# Patient Record
Sex: Female | Born: 1955 | Race: Black or African American | Hispanic: No | Marital: Married | Smoking: Former smoker
Health system: Southern US, Community
[De-identification: ages and names within clinical notes are randomized; demographics above are authoritative.]

## PROBLEM LIST (undated history)

## (undated) DIAGNOSIS — E7800 Pure hypercholesterolemia, unspecified: Secondary | ICD-10-CM

## (undated) DIAGNOSIS — I10 Essential (primary) hypertension: Secondary | ICD-10-CM

## (undated) DIAGNOSIS — M109 Gout, unspecified: Secondary | ICD-10-CM

## (undated) DIAGNOSIS — E119 Type 2 diabetes mellitus without complications: Secondary | ICD-10-CM

## (undated) HISTORY — PX: FRACTURE SURGERY: SHX138

## (undated) HISTORY — PX: ORIF ANKLE FRACTURE: SUR919

---

## 2004-01-26 ENCOUNTER — Emergency Department (HOSPITAL_COMMUNITY): Admission: EM | Admit: 2004-01-26 | Discharge: 2004-01-26 | Payer: Self-pay | Admitting: Emergency Medicine

## 2005-07-15 ENCOUNTER — Ambulatory Visit: Payer: Self-pay | Admitting: Family Medicine

## 2005-07-15 ENCOUNTER — Other Ambulatory Visit: Admission: RE | Admit: 2005-07-15 | Discharge: 2005-07-15 | Payer: Self-pay | Admitting: Family Medicine

## 2005-07-29 ENCOUNTER — Ambulatory Visit: Payer: Self-pay | Admitting: Family Medicine

## 2005-08-19 ENCOUNTER — Ambulatory Visit: Payer: Self-pay | Admitting: Family Medicine

## 2006-04-21 ENCOUNTER — Ambulatory Visit: Payer: Self-pay | Admitting: Internal Medicine

## 2006-06-09 ENCOUNTER — Ambulatory Visit: Payer: Self-pay | Admitting: Family Medicine

## 2007-06-22 ENCOUNTER — Ambulatory Visit: Payer: Self-pay | Admitting: Internal Medicine

## 2007-06-22 LAB — CONVERTED CEMR LAB
ALT: 11 units/L (ref 0–35)
AST: 15 units/L (ref 0–37)
Albumin: 4.1 g/dL (ref 3.5–5.2)
Alkaline Phosphatase: 42 units/L (ref 39–117)
BUN: 13 mg/dL (ref 6–23)
Basophils Absolute: 0 10*3/uL (ref 0.0–0.1)
Basophils Relative: 0 % (ref 0–1)
CO2: 20 meq/L (ref 19–32)
Calcium: 9.4 mg/dL (ref 8.4–10.5)
Chloride: 107 meq/L (ref 96–112)
Cholesterol: 217 mg/dL — ABNORMAL HIGH (ref 0–200)
Creatinine, Ser: 1.05 mg/dL (ref 0.40–1.20)
Eosinophils Absolute: 0.1 10*3/uL (ref 0.0–0.7)
Eosinophils Relative: 1 % (ref 0–5)
Glucose, Bld: 103 mg/dL — ABNORMAL HIGH (ref 70–99)
HCT: 36.2 % (ref 36.0–46.0)
HDL: 39 mg/dL — ABNORMAL LOW (ref >39)
Hemoglobin: 11.4 g/dL — ABNORMAL LOW (ref 12.0–15.0)
LDL Cholesterol: 118 mg/dL — ABNORMAL HIGH (ref 0–99)
Lymphocytes Relative: 35 % (ref 12–46)
Lymphs Abs: 2.5 10*3/uL (ref 0.7–3.3)
MCHC: 31.5 g/dL (ref 30.0–36.0)
MCV: 75.7 fL — ABNORMAL LOW (ref 78.0–100.0)
Monocytes Absolute: 0.5 10*3/uL (ref 0.2–0.7)
Monocytes Relative: 7 % (ref 3–11)
Neutro Abs: 4 10*3/uL (ref 1.7–7.7)
Neutrophils Relative %: 56 % (ref 43–77)
Platelets: 299 10*3/uL (ref 150–400)
Potassium: 4.1 meq/L (ref 3.5–5.3)
RBC: 4.78 M/uL (ref 3.87–5.11)
RDW: 17.9 % — ABNORMAL HIGH (ref 11.5–14.0)
Sodium: 139 meq/L (ref 135–145)
Total Bilirubin: 0.2 mg/dL — ABNORMAL LOW (ref 0.3–1.2)
Total CHOL/HDL Ratio: 5.6
Total Protein: 7.5 g/dL (ref 6.0–8.3)
Triglycerides: 301 mg/dL — ABNORMAL HIGH (ref <150)
VLDL: 60 mg/dL — ABNORMAL HIGH (ref 0–40)
WBC: 7 10*3/uL (ref 4.0–10.5)

## 2008-02-19 ENCOUNTER — Ambulatory Visit: Payer: Self-pay | Admitting: Internal Medicine

## 2008-03-14 ENCOUNTER — Ambulatory Visit: Payer: Self-pay | Admitting: Cardiovascular Disease

## 2008-04-21 ENCOUNTER — Ambulatory Visit: Payer: Self-pay

## 2008-04-21 ENCOUNTER — Encounter: Payer: Self-pay | Admitting: Cardiovascular Disease

## 2008-09-12 ENCOUNTER — Ambulatory Visit: Payer: Self-pay | Admitting: Family Medicine

## 2009-01-27 ENCOUNTER — Inpatient Hospital Stay (HOSPITAL_COMMUNITY): Admission: EM | Admit: 2009-01-27 | Discharge: 2009-01-30 | Payer: Self-pay | Admitting: Emergency Medicine

## 2009-04-24 ENCOUNTER — Encounter: Admission: RE | Admit: 2009-04-24 | Discharge: 2009-06-08 | Payer: Self-pay | Admitting: Orthopaedic Surgery

## 2009-04-26 ENCOUNTER — Ambulatory Visit: Payer: Self-pay | Admitting: Internal Medicine

## 2009-06-07 ENCOUNTER — Ambulatory Visit: Payer: Self-pay | Admitting: Internal Medicine

## 2009-06-07 ENCOUNTER — Encounter: Payer: Self-pay | Admitting: Family Medicine

## 2009-06-07 LAB — CONVERTED CEMR LAB
ALT: 11 units/L (ref 0–35)
AST: 13 units/L (ref 0–37)
Albumin: 3.9 g/dL (ref 3.5–5.2)
Alkaline Phosphatase: 38 units/L — ABNORMAL LOW (ref 39–117)
BUN: 14 mg/dL (ref 6–23)
Basophils Absolute: 0 10*3/uL (ref 0.0–0.1)
Basophils Relative: 0 % (ref 0–1)
CO2: 20 meq/L (ref 19–32)
Calcium: 9.1 mg/dL (ref 8.4–10.5)
Chloride: 105 meq/L (ref 96–112)
Cholesterol: 193 mg/dL (ref 0–200)
Creatinine, Ser: 0.76 mg/dL (ref 0.40–1.20)
Eosinophils Absolute: 0.1 10*3/uL (ref 0.0–0.7)
Eosinophils Relative: 1 % (ref 0–5)
Glucose, Bld: 112 mg/dL — ABNORMAL HIGH (ref 70–99)
HCT: 34.2 % — ABNORMAL LOW (ref 36.0–46.0)
HDL: 36 mg/dL — ABNORMAL LOW (ref >39)
Hemoglobin: 11.2 g/dL — ABNORMAL LOW (ref 12.0–15.0)
Lymphocytes Relative: 36 % (ref 12–46)
Lymphs Abs: 2.4 10*3/uL (ref 0.7–4.0)
MCHC: 32.7 g/dL (ref 30.0–36.0)
MCV: 88.8 fL (ref 78.0–100.0)
Monocytes Absolute: 0.6 10*3/uL (ref 0.1–1.0)
Monocytes Relative: 9 % (ref 3–12)
Neutro Abs: 3.6 10*3/uL (ref 1.7–7.7)
Neutrophils Relative %: 54 % (ref 43–77)
Platelets: 302 10*3/uL (ref 150–400)
Potassium: 4.3 meq/L (ref 3.5–5.3)
RBC: 3.85 M/uL — ABNORMAL LOW (ref 3.87–5.11)
RDW: 16 % — ABNORMAL HIGH (ref 11.5–15.5)
Sed Rate: 38 mm/hr — ABNORMAL HIGH (ref 0–22)
Sodium: 136 meq/L (ref 135–145)
TSH: 0.877 microintl units/mL (ref 0.350–4.500)
Total Bilirubin: 0.2 mg/dL — ABNORMAL LOW (ref 0.3–1.2)
Total CHOL/HDL Ratio: 5.4
Total Protein: 6.9 g/dL (ref 6.0–8.3)
Triglycerides: 416 mg/dL — ABNORMAL HIGH (ref <150)
WBC: 6.7 10*3/uL (ref 4.0–10.5)

## 2009-06-14 ENCOUNTER — Encounter: Payer: Self-pay | Admitting: Family Medicine

## 2009-06-14 ENCOUNTER — Ambulatory Visit: Payer: Self-pay | Admitting: Internal Medicine

## 2009-06-14 LAB — CONVERTED CEMR LAB
Chlamydia, DNA Probe: NEGATIVE
GC Probe Amp, Genital: NEGATIVE

## 2009-08-11 ENCOUNTER — Ambulatory Visit: Payer: Self-pay | Admitting: Family Medicine

## 2009-08-16 ENCOUNTER — Ambulatory Visit: Payer: Self-pay | Admitting: *Deleted

## 2009-09-06 ENCOUNTER — Ambulatory Visit: Payer: Self-pay | Admitting: Internal Medicine

## 2009-09-06 ENCOUNTER — Encounter: Payer: Self-pay | Admitting: Family Medicine

## 2009-09-06 LAB — CONVERTED CEMR LAB
ALT: 33 units/L (ref 0–35)
AST: 23 units/L (ref 0–37)
Albumin: 4.6 g/dL (ref 3.5–5.2)
Alkaline Phosphatase: 43 units/L (ref 39–117)
BUN: 12 mg/dL (ref 6–23)
CO2: 19 meq/L (ref 19–32)
Calcium: 9.7 mg/dL (ref 8.4–10.5)
Chloride: 107 meq/L (ref 96–112)
Cholesterol: 179 mg/dL (ref 0–200)
Creatinine, Ser: 0.7 mg/dL (ref 0.40–1.20)
Glucose, Bld: 109 mg/dL — ABNORMAL HIGH (ref 70–99)
HDL: 38 mg/dL — ABNORMAL LOW (ref >39)
LDL Cholesterol: 116 mg/dL — ABNORMAL HIGH (ref 0–99)
Potassium: 4.5 meq/L (ref 3.5–5.3)
Sodium: 139 meq/L (ref 135–145)
Total Bilirubin: 0.3 mg/dL (ref 0.3–1.2)
Total CHOL/HDL Ratio: 4.7
Total Protein: 7.4 g/dL (ref 6.0–8.3)
Triglycerides: 127 mg/dL (ref <150)
VLDL: 25 mg/dL (ref 0–40)

## 2009-11-02 ENCOUNTER — Ambulatory Visit: Payer: Self-pay | Admitting: Internal Medicine

## 2009-11-03 ENCOUNTER — Ambulatory Visit (HOSPITAL_COMMUNITY): Admission: RE | Admit: 2009-11-03 | Discharge: 2009-11-03 | Payer: Self-pay | Admitting: Internal Medicine

## 2009-11-22 ENCOUNTER — Ambulatory Visit: Payer: Self-pay | Admitting: Internal Medicine

## 2010-05-28 ENCOUNTER — Ambulatory Visit: Payer: Self-pay | Admitting: Internal Medicine

## 2010-06-11 ENCOUNTER — Ambulatory Visit: Payer: Self-pay | Admitting: Internal Medicine

## 2010-06-18 ENCOUNTER — Ambulatory Visit: Payer: Self-pay | Admitting: Internal Medicine

## 2010-06-18 LAB — CONVERTED CEMR LAB: Microalb, Ur: 2.24 mg/dL — ABNORMAL HIGH (ref 0.00–1.89)

## 2010-06-26 ENCOUNTER — Ambulatory Visit (HOSPITAL_COMMUNITY): Admission: RE | Admit: 2010-06-26 | Discharge: 2010-06-26 | Payer: Self-pay | Admitting: Family Medicine

## 2010-08-08 IMAGING — CR DG KNEE 1-2V*R*
2 series · 2 of 2 positions shown · non-contrast
Comparison: None

CLINICAL DATA: Pain, swelling.  No known injury.

RIGHT KNEE - 1-2 VIEW

[w knee ap right *]
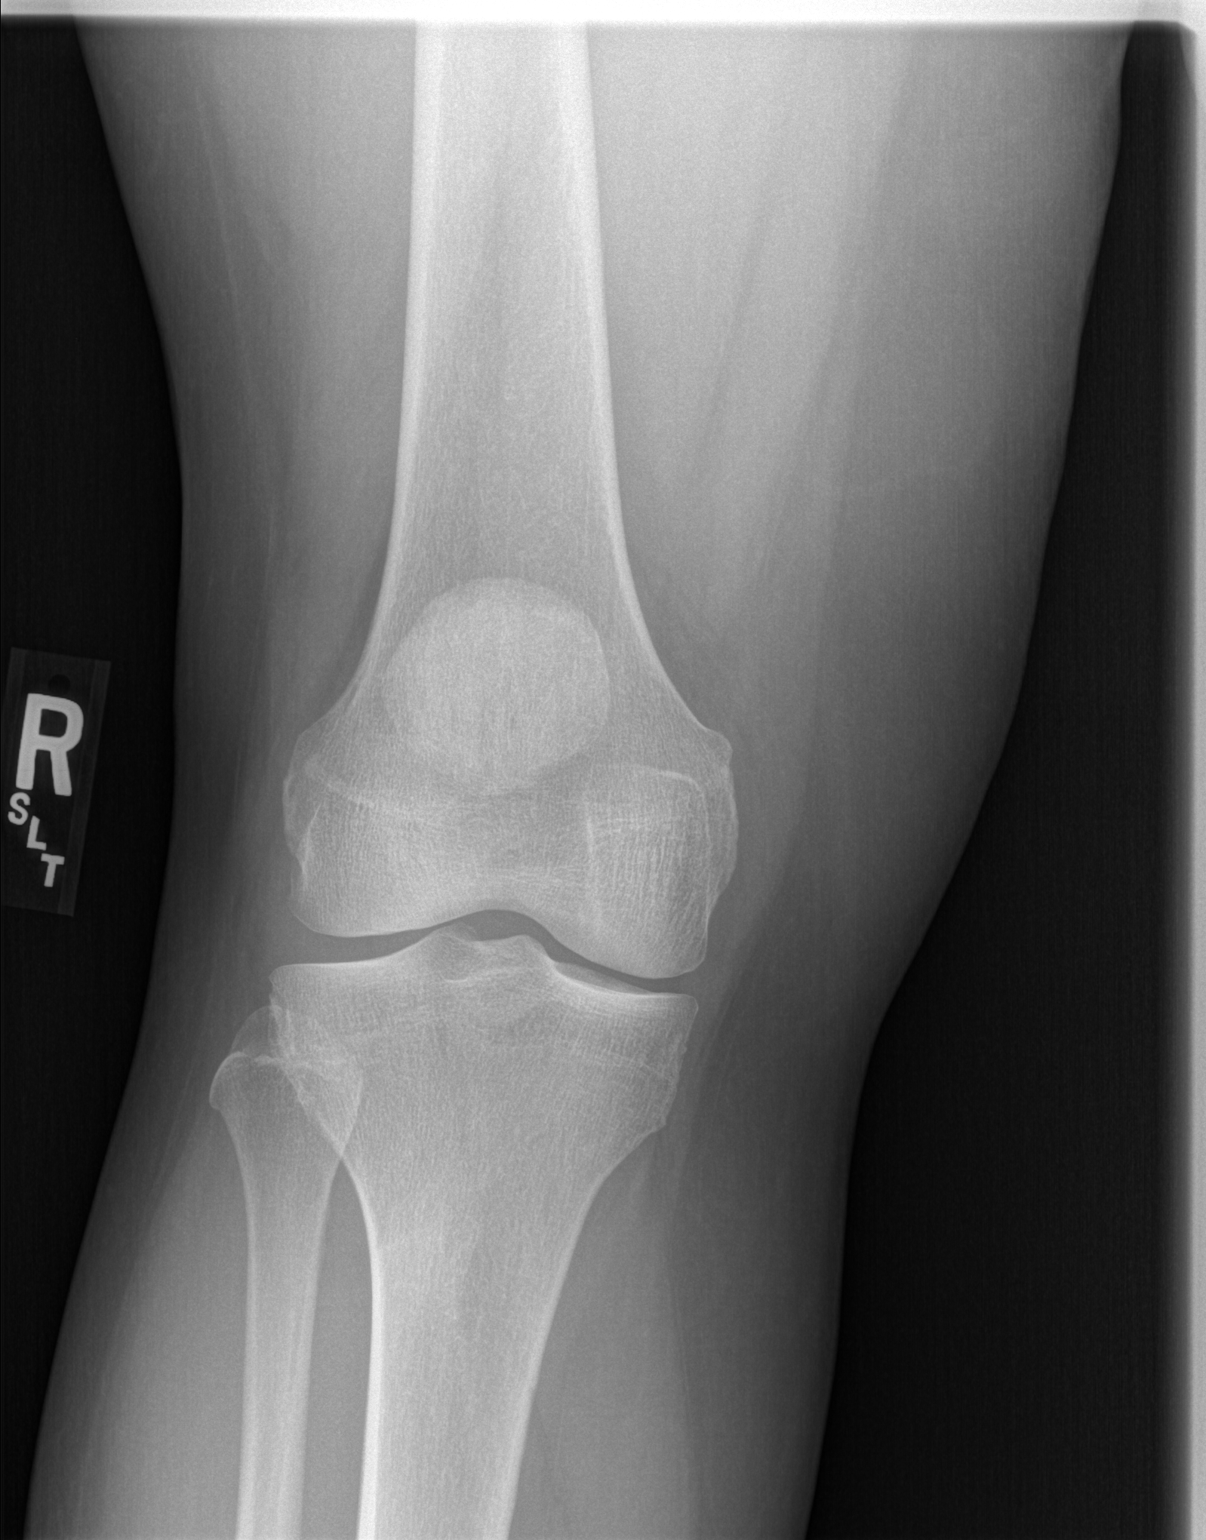

[w knee lat. right *]
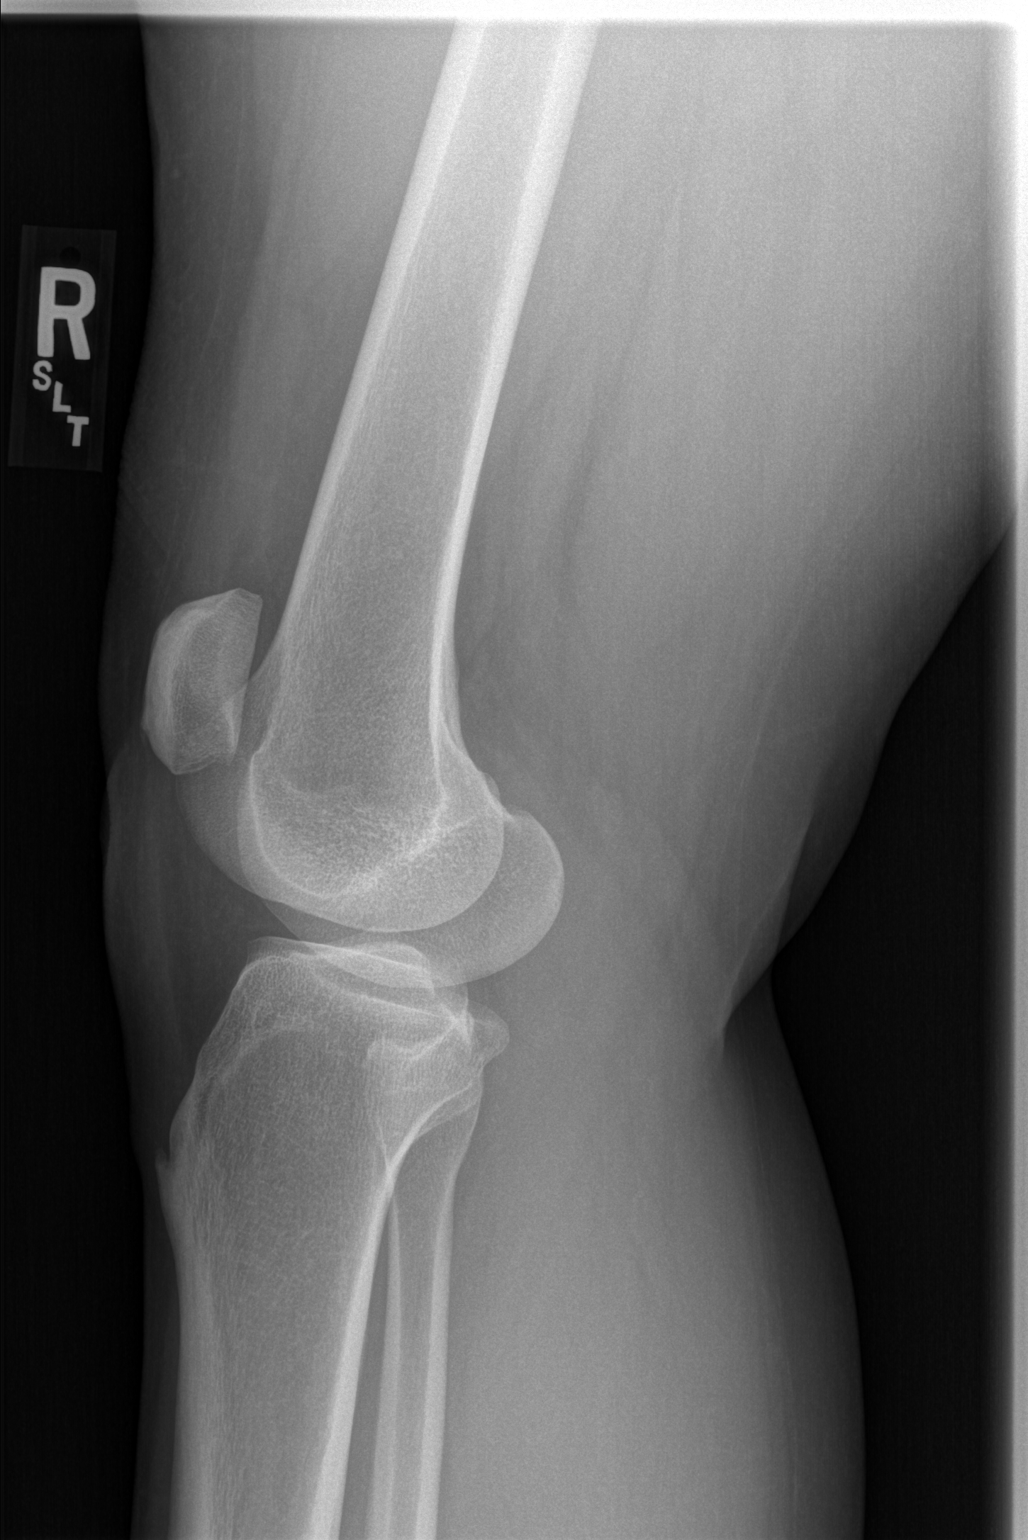

[2 of 2 positions shown; findings below may reference images not displayed]

FINDINGS: Patella appears high riding. No acute bony abnormality.
Specifically, no fracture, subluxation, or dislocation.  Soft
tissues are intact. No joint effusion.
IMPRESSION: Apparent high riding patella.  No acute bony abnormality.

## 2010-11-06 ENCOUNTER — Emergency Department (HOSPITAL_COMMUNITY)
Admission: EM | Admit: 2010-11-06 | Discharge: 2010-11-06 | Payer: Self-pay | Source: Home / Self Care | Admitting: Emergency Medicine

## 2010-12-10 ENCOUNTER — Encounter (INDEPENDENT_AMBULATORY_CARE_PROVIDER_SITE_OTHER): Payer: Self-pay | Admitting: *Deleted

## 2010-12-10 LAB — CONVERTED CEMR LAB
ALT: 29 units/L (ref 0–35)
AST: 23 units/L (ref 0–37)
Albumin: 4.5 g/dL (ref 3.5–5.2)
Alkaline Phosphatase: 41 units/L (ref 39–117)
BUN: 16 mg/dL (ref 6–23)
Basophils Absolute: 0 10*3/uL (ref 0.0–0.1)
Basophils Relative: 0 % (ref 0–1)
CO2: 25 meq/L (ref 19–32)
Calcium: 9.9 mg/dL (ref 8.4–10.5)
Chloride: 105 meq/L (ref 96–112)
Cholesterol: 178 mg/dL (ref 0–200)
Creatinine, Ser: 0.74 mg/dL (ref 0.40–1.20)
Eosinophils Absolute: 0.1 10*3/uL (ref 0.0–0.7)
Eosinophils Relative: 2 % (ref 0–5)
Glucose, Bld: 84 mg/dL (ref 70–99)
HCT: 37.9 % (ref 36.0–46.0)
HDL: 26 mg/dL — ABNORMAL LOW (ref >39)
Hemoglobin: 12.9 g/dL (ref 12.0–15.0)
Hgb A1c MFr Bld: 6.3 % — ABNORMAL HIGH (ref <5.7)
LDL Cholesterol: 107 mg/dL — ABNORMAL HIGH (ref 0–99)
Lymphocytes Relative: 52 % — ABNORMAL HIGH (ref 12–46)
Lymphs Abs: 3.6 10*3/uL (ref 0.7–4.0)
MCHC: 34 g/dL (ref 30.0–36.0)
MCV: 87.1 fL (ref 78.0–100.0)
Monocytes Absolute: 0.5 10*3/uL (ref 0.1–1.0)
Monocytes Relative: 7 % (ref 3–12)
Neutro Abs: 2.7 10*3/uL (ref 1.7–7.7)
Neutrophils Relative %: 40 % — ABNORMAL LOW (ref 43–77)
Platelets: 303 10*3/uL (ref 150–400)
Potassium: 4.2 meq/L (ref 3.5–5.3)
RBC: 4.35 M/uL (ref 3.87–5.11)
RDW: 14.3 % (ref 11.5–15.5)
Sodium: 140 meq/L (ref 135–145)
Total Bilirubin: 0.3 mg/dL (ref 0.3–1.2)
Total CHOL/HDL Ratio: 6.8
Total Protein: 7.3 g/dL (ref 6.0–8.3)
Triglycerides: 227 mg/dL — ABNORMAL HIGH (ref <150)
VLDL: 45 mg/dL — ABNORMAL HIGH (ref 0–40)
WBC: 6.9 10*3/uL (ref 4.0–10.5)

## 2010-12-20 ENCOUNTER — Inpatient Hospital Stay (INDEPENDENT_AMBULATORY_CARE_PROVIDER_SITE_OTHER)
Admission: RE | Admit: 2010-12-20 | Discharge: 2010-12-20 | Disposition: A | Discharge status: Home / Self Care | Payer: Self-pay | Source: Ambulatory Visit | Attending: Family Medicine | Admitting: Family Medicine

## 2010-12-20 DIAGNOSIS — J069 Acute upper respiratory infection, unspecified: Secondary | ICD-10-CM

## 2011-02-06 LAB — CBC
HCT: 31 % — ABNORMAL LOW (ref 36.0–46.0)
HCT: 35.3 % — ABNORMAL LOW (ref 36.0–46.0)
HCT: 35.8 % — ABNORMAL LOW (ref 36.0–46.0)
Hemoglobin: 11.1 g/dL — ABNORMAL LOW (ref 12.0–15.0)
Hemoglobin: 12.1 g/dL (ref 12.0–15.0)
Hemoglobin: 12.1 g/dL (ref 12.0–15.0)
MCHC: 33.8 g/dL (ref 30.0–36.0)
MCHC: 34.4 g/dL (ref 30.0–36.0)
MCHC: 35.7 g/dL (ref 30.0–36.0)
MCV: 88.2 fL (ref 78.0–100.0)
MCV: 88.3 fL (ref 78.0–100.0)
MCV: 89.9 fL (ref 78.0–100.0)
Platelets: 239 10*3/uL (ref 150–400)
Platelets: 242 10*3/uL (ref 150–400)
Platelets: 248 10*3/uL (ref 150–400)
RBC: 3.51 MIL/uL — ABNORMAL LOW (ref 3.87–5.11)
RBC: 3.98 MIL/uL (ref 3.87–5.11)
RBC: 4 MIL/uL (ref 3.87–5.11)
RDW: 14.6 % (ref 11.5–15.5)
RDW: 14.7 % (ref 11.5–15.5)
RDW: 15 % (ref 11.5–15.5)
WBC: 11.8 10*3/uL — ABNORMAL HIGH (ref 4.0–10.5)
WBC: 6.7 10*3/uL (ref 4.0–10.5)
WBC: 7.2 10*3/uL (ref 4.0–10.5)

## 2011-02-06 LAB — BASIC METABOLIC PANEL
BUN: 11 mg/dL (ref 6–23)
BUN: 9 mg/dL (ref 6–23)
CO2: 24 mEq/L (ref 19–32)
CO2: 24 mEq/L (ref 19–32)
Calcium: 8.2 mg/dL — ABNORMAL LOW (ref 8.4–10.5)
Calcium: 8.7 mg/dL (ref 8.4–10.5)
Chloride: 104 mEq/L (ref 96–112)
Chloride: 110 mEq/L (ref 96–112)
Creatinine, Ser: 0.82 mg/dL (ref 0.4–1.2)
Creatinine, Ser: 0.97 mg/dL (ref 0.4–1.2)
GFR calc Af Amer: >60 mL/min (ref >60)
GFR calc Af Amer: >60 mL/min (ref >60)
GFR calc non Af Amer: >60 mL/min (ref >60)
GFR calc non Af Amer: >60 mL/min (ref >60)
Glucose, Bld: 154 mg/dL — ABNORMAL HIGH (ref 70–99)
Glucose, Bld: 93 mg/dL (ref 70–99)
Potassium: 3.9 mEq/L (ref 3.5–5.1)
Potassium: 4.5 mEq/L (ref 3.5–5.1)
Sodium: 140 mEq/L (ref 135–145)
Sodium: 141 mEq/L (ref 135–145)

## 2011-02-06 LAB — POCT I-STAT, CHEM 8
BUN: 11 mg/dL (ref 6–23)
Calcium, Ion: 1.13 mmol/L (ref 1.12–1.32)
Chloride: 111 mEq/L (ref 96–112)
Creatinine, Ser: 0.8 mg/dL (ref 0.4–1.2)
Glucose, Bld: 108 mg/dL — ABNORMAL HIGH (ref 70–99)
HCT: 37 % (ref 36.0–46.0)
Hemoglobin: 12.6 g/dL (ref 12.0–15.0)
Potassium: 3.7 mEq/L (ref 3.5–5.1)
Sodium: 141 mEq/L (ref 135–145)
TCO2: 21 mmol/L (ref 0–100)

## 2011-02-06 LAB — SAMPLE TO BLOOD BANK

## 2011-03-12 NOTE — Op Note (Signed)
NAMEMAHREEN, SCHEWE                  ACCOUNT NO.:  [id number]   MEDICAL RECORD NO.:  [id number]          PATIENT TYPE:  OBV   LOCATION:  5028                         FACILITY:  [facility]   PHYSICIAN:  Claude Manges. Whitfield, M.D.DATE OF BIRTH:  1956/01/23   DATE OF PROCEDURE:  01/27/2009  DATE OF DISCHARGE:                               OPERATIVE REPORT   PREOPERATIVE DIAGNOSES:  1. Open fracture dislocation, left ankle.  2. Status post initial reduction in the emergency room with laceration      medially.  3. Comminuted displaced distal fibular fracture.   POSTOPERATIVE DIAGNOSES:  1. Open fracture dislocation, left ankle.  2. Status post initial reduction in the emergency room with laceration      medially.  3. Comminuted displaced distal fibular fracture.   PROCEDURES:  1. Irrigation and debridement of 7 cm laceration, medial aspect, left      ankle with repair of deltoid ligament in the anterior capsule.  2. Open reduction and internal fixation of comminuted distal fibular      fracture.   SURGEON:  Claude Manges. Cleophas Dunker, MD   ASSISTANT:  Karie Chimera, PA-C   ANESTHESIA:  General orotracheal.   COMPLICATIONS:  None.   HISTORY:  This 55 year old female had a misstep this late afternoon with  an inversion stress to her ankle.  She sustained an open fracture  dislocation.  When she was seen in the emergency room, the emergency  room physician reduced the fracture, applied a sterile dressing to the  open laceration about the just prior proximal to the medial malleolus  that was transverse and 7 cm in length, and then splinted the fracture.  X-rays revealed some widening of the ankle mortise with a grossly  comminuted displaced fibular fracture.   PROCEDURE:  The patient is comfortable on the operating table, and under  general orotracheal anesthesia, the left lower extremity was placed in a  thigh tourniquet.  At that point, the splint was removed, extremity was  then prepped  with Betadine scrub and Betadine solution from the tips of  the toes to the knee.  Sterile draping was performed with the extremity  elevated, maintaining reduction of the ankle.  Extremity was Esmarch  exsanguinated with the proximal tourniquet at 350 mmHg.   The 7 cm laceration was transverse, had localized above the medial  malleolus in the medial aspect of the ankle.  It was not grossly  contaminated.  I copiously irrigated with about 4000 mL of saline.  Retractors were then carefully inserted.  There was a complete tear of  the anterior capsule as well as the medial capsule and the deltoid  ligament.  After further irrigation of the joint and removing any  hematoma, I repaired the deltoid ligament and the anterior capsule with  0 Vicryl.  The skin was again irrigated and then the skin closed with  interrupted 4-0 Ethilon.   A longitudinal incision was made along the fibula extending from the tip  of the fibula proximally about 4 inches.  Via sharp dissection, incision  was carried down to subcutaneous  tissue.  Then, via blunt dissection,  soft tissue was elevated from the muscle fascia.  Hematoma was  encountered then I could easily opened the fascia along the length of  the fibular fracture.  The distal 3 or 4 inches of the fibula was  markedly comminuted and displaced.  I was able to place some of the  sagittal fractures in anatomic position, and then applied  circumferential 0 Vicryl suture to maintain position.  The distal fibula  was then reduced and maintained with bone clamps.  A seven hole semi 10  hole, semi tubular locking plate was then applied and bent to the shape  of the fibula.  The 2 distal holes were drilled, measured and filled  with locking screws.  The 2 holes proximal to those were then left open  because of the involved fracture and then I filled 5 of the remaining 6  screw holes proximally and excellent bone after drilling, measuring, and  inserting  nonlocking screws.  There was a void of bone right at the  distal tib-fib junction.  I filled it with bone graft from the distal  tibia after making a small hole with the drill and using a small  curette.  The wound was irrigated with saline solution.  The fascia  closed with 0 and 2-0 Vicryl and skin closed with skin clips.  Sterile  bulky dressing was applied followed by posterior splint.  Tourniquet was  deflated with immediate capillary refill to the toes.   The patient tolerated the procedure without complications.      Claude Manges. Cleophas Dunker, M.D.  Electronically Signed     PWW/MEDQ  D:  01/27/2009  T:  01/28/2009  Job:  [id number]

## 2011-03-12 NOTE — Assessment & Plan Note (Signed)
[facility]                            CARDIOLOGY OFFICE NOTE   Julie Foster                         MRN:          [id number]  DATE:03/14/2008                            DOB:          Mar 21, 1956    Ms. Julie Foster is a delightful 51-year patient referred by [facility] for  hypertension, dyspnea and murmur.   The patient has had hypertension for a long time, however, it was only  recently diagnosed in the last year or so.  She apparently had a  toothache and they would not work on her teeth until she got her cardiac  status looked into.  Her hypertension has been somewhat difficult to  control; however, she recently, about three weeks ago, had her  medications changed and it seems to be in a better range.  The patient  has not had any significant chest pain.  She gets exertional dyspnea, it  appears functional.  She has not had any history of cardiopulmonary  disease in the past.  She is a nonsmoker with no chronic lung disease.   She has no chronic lung disease but does smoke a pack a day for about 20  years.   The patient has not had formal PFTs.  She does not actively wheeze.  There has been no history of lower extremity edema, DVT or PE.   The patient's dyspnea is stable.  She has indicated that she has had a  murmur in the past.  As far as I can tell, this has not been checked  out.   She denies having rheumatic fever as a child.   She is fairly sedentary.  She does not exercise on a regular basis.   She has not had any significant PND, orthopnea.  She has had no lower  extremity edema.   She has been compliant with her meds.  She does not own a blood pressure  cuff.   REVIEW OF SYSTEMS:  Otherwise remarkable for some allergies and some  arthritis in the knees.   PAST MEDICAL HISTORY:  Remarkable for  1. Hypertension.  2. Allergies.  3. She has had her tubes tied.   SOCIAL HISTORY:  The patient is a single mother of three  children.  The  two youngest are 71 and 31 and still live at home.  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  She loves to watch movies and is fairly  sedentary.  She claims not to use salt in her diet, but does not appear  to be very aggressive about her carbohydrates.   FAMILY HISTORY:  Remarkable for mother dying in her 58s of cancer.  She  did not know her father.   CURRENT MEDICATIONS:  1. Lisinopril/hydrochlorothiazide 20/12.5.  2. Lipitor 28.  3. Amlodipine 5.  4. An aspirin a day.   ALLERGIES:  She denies any allergies.   PHYSICAL EXAMINATION:  GENERAL APPEARANCE:  Remarkable for an overweight  black female in no distress.  VITAL SIGNS:  Blood pressure is 140/80, pulse 70 and regular,  respiratory rate 14, afebrile.  Weight is 205.  HEENT:  Unremarkable.  NECK:  Carotids are normal without bruit, no lymphadenopathy,  thyromegaly or JVP elevation.  LUNGS:  Clear with good diaphragmatic motion.  No wheezing.  CARDIOVASCULAR:  S1 and S2 with a systolic ejection murmur.  PMI normal.  ABDOMEN:  Benign.  Bowel sounds positive.  No AAA, no tenderness, no  hepatosplenomegaly or hepatojugular reflux.  No obvious bruit but she is  obese.  No tenderness.  EXTREMITIES:  Distal pulses are intact, no edema.  NEURO:  Nonfocal.  SKIN:  Warm and dry.  MUSCULOSKELETAL:  No muscular weakness.   EKG shows sinus rhythm with LVH.   IMPRESSION:  1. Hypertension, currently well controlled.  The patient will get a      blood pressure cuff to monitor her pressures at home.  I told her      to bring it in to calibrate it with ours here at the office.  I      will see her back in eight weeks.  She certainly has room to go up      on her lisinopril or amlodipine, but she currently appears to be      under reasonable control.  2. Systolic ejection murmur.  Check 2-D echocardiogram, likely aortic      sclerosis.  At that time, we will also assess left ventricular      hypertrophy which  seems to be apparent on her EKG.  3. Dyspnea, appears functional.  We will check 2-D echocardiogram to      rule out cor pulmonale and assess left ventricular function.      Patient needs to be more active and get on an exercise program.      She may have a component of diastolic dysfunction and this will be      improved with better blood pressure control.  4. Hypercholesterolemia.  Again, the patient needs to see a dietitian      and have a low carbohydrate diet.  She is at risk for type 2      diabetes.  Continue Lipitor 20 a day.  5. Health maintenance.  The patient should have a hemoglobin A1c to      assess for metabolic syndrome and type 2 diabetes.   I will see her back in about eight weeks to further assess her blood  pressure control.     Julie Foster. Julie Emms, MD, [facility]  Electronically Signed    PCN/MedQ  DD: 03/14/2008  DT: 03/14/2008  Job #: [id number]   cc:   Julie Foster

## 2011-03-15 NOTE — Discharge Summary (Signed)
NAMEKAYLLA, Foster                  ACCOUNT NO.:  [id number]   MEDICAL RECORD NO.:  [id number]          PATIENT TYPE:  INP   LOCATION:  5028                         FACILITY:  [facility]   PHYSICIAN:  Claude Manges. Whitfield, M.D.DATE OF BIRTH:  [birth year]   DATE OF ADMISSION:  01/27/2009  DATE OF DISCHARGE:  01/30/2009                               DISCHARGE SUMMARY   ADMISSION DIAGNOSIS:  Open left ankle fracture dislocation.   DISCHARGE DIAGNOSES:  1. Open left ankle fracture dislocation.  2. Hypertension.  3. Cigarette smoker.   PROCEDURE:  ORIF left ankle with irrigation and debridement and closure  of open wound.   HISTORY:  Ms. Julie Foster is a 55 year old African American female who was  walking down the stairs and fell injuring her left ankle.  She has  sustained an open fracture dislocation and was brought to the emergency  room where the dislocation was reduced by Dr. Ethelda Chick.  We were then  consulted for evaluation and treatment plans.  It was felt that she was  necessary to be taken to the operating room for an ORIF of the ankle  with irrigation and debridement.   HOSPITAL COURSE:  A 55 year old African American female admitted on  January 27, 2009, after appropriate laboratory studies were obtained as  well as 1 g of Ancef IV on-call and vancomycin 1 g IV on-call.  She was  taken to the operating room where she underwent an open reduction and  internal fixation of the left ankle.  She also had an I and D of the  wound on closure.  She tolerated the procedure well.  She was placed on  Ancef 1 g IV q.8 hours and gentamicin x48 hours per pharmacy dosing.  She did not receive the vancomycin preoperatively.  She was placed on a  Dilaudid PCA protocol.  Toradol 30 was given q.6 hours x2 doses.  Gentamicin was given 500 mg IV q.24 hours x2 doses.  Foley cath was  placed on January 28, 2009.  This was discontinued on January 29, 2009, as  well as the PCA.  PT for ambulation with Glaude,  nonweightbearing on the  left.  [employer] on January 30, 2009, was placed a left lower extremity  posterior splint.  She was allowed to be touchdown weightbearing at that  time with the splint.  The remainder of her hospital course was  uneventful.  She was given Lovenox instructions and was discharged on  January 30, 2009, return back to the office in followup.   RADIOGRAPHIC STUDIES:  Fluoroscopy for the procedure only.   Laboratory studies reveals a preop hemoglobin 12.1, hematocrit 35.3%,  white count 6700, platelets 248,000.  Discharge hemoglobin 11.1,  hematocrit 31.0%, white count 7200, platelets was 239,000.  Preop sodium  141, potassium 3.7, chloride 111, glucose 108, BUN 11.  Discharge sodium  140, potassium 4.5, chloride 104, CO2 24, glucose 93, BUN 11, creatinine  0.82.  GFR was greater than 60.   DISCHARGE INSTRUCTIONS:  No restrictions on diet.  I will leave the  dressing in place, keep it clean  and dry.  Prescription for:  1. Percocet 5/325 one to two tabs every 4 hours as needed for pain.  2. Keflex 500 mg 1 capsule by mouth 3 times a day.  3. Lovenox injections once daily for 2 weeks.   Remain nonweightbearing on the left foot.  She will follow back up with  [place] on Monday postoperatively with Dr. Cleophas Dunker.  She is discharged in  improved condition.      Oris Drone Petrarca, P.A.-C.      Claude Manges. Cleophas Dunker, M.D.  Electronically Signed    BDP/MEDQ  D:  03/12/2009  T:  03/13/2009  Job:  [id number]

## 2014-06-29 ENCOUNTER — Encounter (HOSPITAL_COMMUNITY): Payer: Self-pay | Admitting: Emergency Medicine

## 2014-06-29 ENCOUNTER — Emergency Department (HOSPITAL_COMMUNITY)
Admission: EM | Admit: 2014-06-29 | Discharge: 2014-06-29 | Disposition: A | Discharge status: Home / Self Care | Payer: BC Managed Care – PPO | Attending: Emergency Medicine | Admitting: Emergency Medicine

## 2014-06-29 DIAGNOSIS — F17200 Nicotine dependence, unspecified, uncomplicated: Secondary | ICD-10-CM | POA: Diagnosis not present

## 2014-06-29 DIAGNOSIS — E7800 Pure hypercholesterolemia, unspecified: Secondary | ICD-10-CM | POA: Insufficient documentation

## 2014-06-29 DIAGNOSIS — E119 Type 2 diabetes mellitus without complications: Secondary | ICD-10-CM | POA: Diagnosis not present

## 2014-06-29 DIAGNOSIS — R42 Dizziness and giddiness: Secondary | ICD-10-CM | POA: Diagnosis present

## 2014-06-29 DIAGNOSIS — I10 Essential (primary) hypertension: Secondary | ICD-10-CM | POA: Insufficient documentation

## 2014-06-29 HISTORY — DX: Essential (primary) hypertension: I10

## 2014-06-29 HISTORY — DX: Pure hypercholesterolemia, unspecified: E78.00

## 2014-06-29 HISTORY — DX: Type 2 diabetes mellitus without complications: E11.9

## 2014-06-29 LAB — CBG MONITORING, ED: Glucose-Capillary: 141 mg/dL — ABNORMAL HIGH (ref 70–99)

## 2014-06-29 MED ORDER — ONDANSETRON 8 MG PO TBDP: 8.0000 mg | ORAL_TABLET | Freq: Three times a day (TID) | ORAL | Status: AC | PRN

## 2014-06-29 MED ORDER — DIAZEPAM 5 MG PO TABS: 5.0000 mg | ORAL_TABLET | Freq: Three times a day (TID) | ORAL | Status: DC | PRN

## 2014-06-29 MED ORDER — DIAZEPAM 5 MG PO TABS
5.0000 mg | ORAL_TABLET | Freq: Once | ORAL | Status: AC
  Administered 2014-06-29: 5 mg via ORAL
  Filled 2014-06-29: qty 1

## 2014-06-29 MED ORDER — MECLIZINE HCL 25 MG PO TABS: 25.0000 mg | ORAL_TABLET | Freq: Three times a day (TID) | ORAL | Status: AC | PRN

## 2014-06-29 MED ORDER — MECLIZINE HCL 25 MG PO TABS
25.0000 mg | ORAL_TABLET | Freq: Once | ORAL | Status: AC
  Administered 2014-06-29: 25 mg via ORAL
  Filled 2014-06-29: qty 1

## 2014-06-29 MED ORDER — ONDANSETRON 8 MG PO TBDP
8.0000 mg | ORAL_TABLET | Freq: Once | ORAL | Status: AC
  Administered 2014-06-29: 8 mg via ORAL
  Filled 2014-06-29: qty 1

## 2014-06-29 NOTE — ED Notes (Signed)
She capably ambulated in our hallway for a short distance.  She states she has a remnant of the "dizziness" left, however, she is pleased to find she no longer has to hold onto things to maintain her balance.  She remains oriented x 4 with clear speech.

## 2014-06-29 NOTE — Discharge Instructions (Signed)
Benign Positional Vertigo Vertigo means you feel like you or your surroundings are moving when they are not. Benign positional vertigo is the most common form of vertigo. Benign means that the cause of your condition is not serious. Benign positional vertigo is more common in older adults. CAUSES  Benign positional vertigo is the result of an upset in the labyrinth system. This is an area in the middle ear that helps control your balance. This may be caused by a viral infection, head injury, or repetitive motion. However, often no specific cause is found. SYMPTOMS  Symptoms of benign positional vertigo occur when you move your head or eyes in different directions. Some of the symptoms may include:  Loss of balance and falls.  Vomiting.  Blurred vision.  Dizziness.  Nausea.  Involuntary eye movements (nystagmus). DIAGNOSIS  Benign positional vertigo is usually diagnosed by physical exam. If the specific cause of your benign positional vertigo is unknown, your caregiver may perform imaging tests, such as magnetic resonance imaging (MRI) or computed tomography (CT). TREATMENT  Your caregiver may recommend movements or procedures to correct the benign positional vertigo. Medicines such as meclizine, benzodiazepines, and medicines for nausea may be used to treat your symptoms. In rare cases, if your symptoms are caused by certain conditions that affect the inner ear, you may need surgery.  The following sequence of positions describes the Epley maneuver:  The patient begins in an upright sitting posture, with the legs fully extended and the head rotated 45 degrees towards the side in the same direction that gives a positive Dix-Hallpike test. The patient is then quickly and passively forced down backwards by the clinician performing the treatment into a supine position with the head held approximately in a 30 degree neck extension (Dix-Hallpike position), and still rotated to the same. The  clinician observes the patient's eyes for primary stage nystagmus. The patient remains in this position for approximately 1-2 minutes. The patient's head is then rotated 90 degrees to the opposite direction so that the opposite ear faces the ground, all while maintaining the 30 degree neck extension. The patient remains in this position for approximately 1-2 minutes. Keeping the head and neck in a fixed position relative to the body, the individual rolls onto their shoulder, rotating the head another 90 degrees in the direction that they are facing. The patient is now looking downwards at a 45 degree angle. The eyes should be immediately observed by the clinician for secondary stage nystagmus and this secondary stage nystagmus should beat in the same direction as the primary stage nystagmus. The patient remains in this position for approximately 1-2 minutes. Finally, the patient is slowly brought up to an upright sitting posture, while maintaining the 45 degree rotation of the head. The patient holds sitting position for up to 30 seconds. The entire procedure may be repeated two more times, for a total of three times.  During every step of this procedure the patient may experience some dizziness. HOME CARE INSTRUCTIONS   Follow your caregiver's instructions.  Move slowly. Do not make sudden body or head movements.  Avoid driving.  Avoid operating heavy machinery.  Avoid performing any tasks that would be dangerous to you or others during a vertigo episode.  Drink enough fluids to keep your urine clear or pale yellow. SEEK IMMEDIATE MEDICAL CARE IF:   You develop problems with walking, weakness, numbness, or using your arms, hands, or legs.  You have difficulty speaking.  You develop severe headaches.  Your nausea or vomiting continues or gets worse.  You develop visual changes.  Your family or friends notice any behavioral changes.  Your condition gets worse.  You have a  fever.  You develop a stiff neck or sensitivity to light. MAKE SURE YOU:   Understand these instructions.  Will watch your condition.  Will get help right away if you are not doing well or get worse. Document Released: 07/22/2006 Document Revised: 01/06/2012 Document Reviewed: 07/04/2011 [facility] Patient Information 2015 [place], [place]. This information is not intended to replace advice given to you by your health care provider. Make sure you discuss any questions you have with your health care provider.

## 2014-06-29 NOTE — ED Notes (Signed)
She states she awoke as usual to go to work; and as she stood, she noted herself to be dizzy and had to hold "on to stuff to keep my balance".  She denies any pain, but does c/o some nausea.  She denies fever;vomiting/cough/diarrhea, nor any other sign of current illness.

## 2014-06-29 NOTE — ED Provider Notes (Signed)
CSN: [id number]     Arrival date & time 06/29/14  0754 History   First MD Initiated Contact with Patient 06/29/14 [phone]     Chief Complaint  Patient presents with  . Dizziness      HPI Patient reports she awoke this morning and began feeling "dizzy".  She describes this as a sensation of the room spinning.  She states rapid changes in position including sitting forward and changing her head to the left or right made her  Dizziness worse.  She denies lightheadedness.  She's never had vertigo before.  Family reports no change in her speech.  Patient denies unilateral arm or leg weakness.  No facial droop noted by family.  Denies chest pain shortness breath.  No abdominal pain.  No recent illness.  Normal oral intake.  No fevers or chills.  Symptoms are mild to moderate in severity    Past Medical History  Diagnosis Date  . Hypertension   . Diabetes mellitus without complication   . Hypercholesteremia    Past Surgical History  Procedure Laterality Date  . Orif ankle fracture Left    No family history on file. History  Substance Use Topics  . Smoking status: Light Tobacco Smoker  . Smokeless tobacco: Not on file  . Alcohol Use: No   OB History   Grav Para Term Preterm Abortions TAB SAB Ect Mult Living                 Review of Systems  All other systems reviewed and are negative.     Allergies  Review of patient's allergies indicates no known allergies.  Home Medications   Prior to Admission medications   Not on File   BP 145/88  Pulse 70  Temp(Src) 98.6 F (37 C) (Oral)  Resp 16  SpO2 98% Physical Exam  Nursing note and vitals reviewed. Constitutional: She is oriented to person, place, and time. She appears well-developed and well-nourished. No distress.  HENT:  Head: Normocephalic and atraumatic.  Eyes: EOM are normal. Pupils are equal, round, and reactive to light.  Neck: Normal range of motion.  Cardiovascular: Normal rate, regular rhythm and normal heart  sounds.   Pulmonary/Chest: Effort normal and breath sounds normal.  Abdominal: Soft. She exhibits no distension. There is no tenderness.  Musculoskeletal: Normal range of motion.  Neurological: She is alert and oriented to person, place, and time.  5/5 strength in major muscle groups of  bilateral upper and lower extremities. Speech normal. No facial asymetry.   Skin: Skin is warm and dry.  Psychiatric: She has a normal mood and affect. Judgment normal.    ED Course  Procedures (including critical care time) Labs Review Labs Reviewed  CBG MONITORING, ED - Abnormal; Notable for the following:    Glucose-Capillary 141 (*)    All other components within normal limits    Imaging Review No results found.   EKG Interpretation   Date/Time:  Wednesday June 29 2014 08:10:43 EDT Ventricular Rate:  71 PR Interval:  182 QRS Duration: 102 QT Interval:  425 QTC Calculation: 462 R Axis:   48 Text Interpretation:  Sinus rhythm Borderline repolarization abnormality  No old tracing to compare Confirmed by Rebie Peale  MD, Lennette Bihari ([id number]) on  06/29/2014 8:16:03 AM      MDM   Final diagnoses:  Vertigo    9:59 AM Patient is ambulatory in the emergency department.  She's feeling better at this time.  Home with standard vertigo medications.  Home with instructions on how to perform the Epley maneuver.  She understands to return to the ER for new or worsening symptoms.  Doubt stroke/central vertigo    Hoy Morn, MD 06/29/14 1000

## 2018-06-29 ENCOUNTER — Encounter (HOSPITAL_COMMUNITY): Payer: Self-pay | Admitting: *Deleted

## 2018-06-29 ENCOUNTER — Emergency Department (HOSPITAL_COMMUNITY): Payer: Managed Care, Other (non HMO)

## 2018-06-29 ENCOUNTER — Emergency Department (HOSPITAL_COMMUNITY)
Admission: EM | Admit: 2018-06-29 | Discharge: 2018-06-29 | Disposition: A | Discharge status: Home / Self Care | Payer: Managed Care, Other (non HMO) | Attending: Emergency Medicine | Admitting: Emergency Medicine

## 2018-06-29 DIAGNOSIS — I10 Essential (primary) hypertension: Secondary | ICD-10-CM | POA: Insufficient documentation

## 2018-06-29 DIAGNOSIS — M79671 Pain in right foot: Secondary | ICD-10-CM

## 2018-06-29 DIAGNOSIS — E119 Type 2 diabetes mellitus without complications: Secondary | ICD-10-CM | POA: Diagnosis not present

## 2018-06-29 DIAGNOSIS — Z79899 Other long term (current) drug therapy: Secondary | ICD-10-CM | POA: Diagnosis not present

## 2018-06-29 DIAGNOSIS — Z7984 Long term (current) use of oral hypoglycemic drugs: Secondary | ICD-10-CM | POA: Insufficient documentation

## 2018-06-29 DIAGNOSIS — M79672 Pain in left foot: Secondary | ICD-10-CM | POA: Insufficient documentation

## 2018-06-29 HISTORY — DX: Gout, unspecified: M10.9

## 2018-06-29 LAB — CBC
HCT: 31.6 % — ABNORMAL LOW (ref 36.0–46.0)
Hemoglobin: 10.5 g/dL — ABNORMAL LOW (ref 12.0–15.0)
MCH: 29.7 pg (ref 26.0–34.0)
MCHC: 33.2 g/dL (ref 30.0–36.0)
MCV: 89.3 fL (ref 78.0–100.0)
Platelets: 360 10*3/uL (ref 150–400)
RBC: 3.54 MIL/uL — ABNORMAL LOW (ref 3.87–5.11)
RDW: 13.9 % (ref 11.5–15.5)
WBC: 8.7 10*3/uL (ref 4.0–10.5)

## 2018-06-29 LAB — BASIC METABOLIC PANEL
Anion gap: 14 (ref 5–15)
BUN: 14 mg/dL (ref 6–20)
CO2: 26 mmol/L (ref 22–32)
Calcium: 10.7 mg/dL — ABNORMAL HIGH (ref 8.9–10.3)
Chloride: 98 mmol/L (ref 98–111)
Creatinine, Ser: 1.08 mg/dL — ABNORMAL HIGH (ref 0.44–1.00)
GFR calc Af Amer: >60 mL/min (ref >60)
GFR calc non Af Amer: 55 mL/min — ABNORMAL LOW (ref >60)
Glucose, Bld: 99 mg/dL (ref 70–99)
Potassium: 3.8 mmol/L (ref 3.5–5.1)
Sodium: 138 mmol/L (ref 135–145)

## 2018-06-29 MED ORDER — OXYCODONE-ACETAMINOPHEN 5-325 MG PO TABS
2.0000 | ORAL_TABLET | Freq: Once | ORAL | Status: AC
  Administered 2018-06-29: 2 via ORAL
  Filled 2018-06-29: qty 2

## 2018-06-29 MED ORDER — GABAPENTIN 100 MG PO CAPS: 100.0000 mg | ORAL_CAPSULE | Freq: Three times a day (TID) | ORAL | 0 refills | Status: DC

## 2018-06-29 NOTE — Discharge Instructions (Addendum)
Please take medicine as prescribed. Please call your doctor tomorrow for recheck

## 2018-06-29 NOTE — ED Provider Notes (Signed)
[facility] DEPT Provider Note   CSN: [id number] Arrival date & time: 06/29/18  1109     History   Chief Complaint Chief Complaint  Patient presents with  . Foot Pain    bilateral    HPI Galadriel Shroff is a 62 y.o. female.  HPI 62 year old female type 2 diabetes, reports that she has having pain on the bilateral soles of her feet for the past several days.  She worked okay on Friday.  The pain was worse on Saturday.  She has recently seen her doctor and told that she thought she had gout.  This preceded this episode of foot pain.  He checked a uric acid, per the patient, and it was elevated.  She thinks that she is having gout.  She has not had any swelling in the joints of her toes or her ankles.  She has not noted any erythema or warmth. Past Medical History:  Diagnosis Date  . Diabetes mellitus without complication ([place])   . Gout   . Hypertension     There are no active problems to display for this patient.   Past Surgical History:  Procedure Laterality Date  . FRACTURE SURGERY       OB History   None      Home Medications    Prior to Admission medications   Medication Sig Start Date End Date Taking? Authorizing Provider  atorvastatin (LIPITOR) 10 MG tablet Take 10 mg by mouth daily. 06/02/18  Yes [provider]  ferrous sulfate 325 (65 FE) MG EC tablet Take 325 mg by mouth daily with breakfast.   Yes [provider]  indapamide (LOZOL) 2.5 MG tablet Take 2.5 mg by mouth daily. 06/02/18  Yes [provider]  metFORMIN (GLUCOPHAGE) 500 MG tablet Take 500 mg by mouth daily. 06/02/18  Yes [provider]  potassium chloride SA (K-DUR,KLOR-CON) 20 MEQ tablet Take 20 mEq by mouth daily. 06/02/18  Yes [provider]  allopurinol (ZYLOPRIM) 100 MG tablet Take 100 mg by mouth daily. 06/23/18   [provider]    Family History No family history on file.  Social History Social History     Tobacco Use  . Smoking status: Never Smoker  . Smokeless tobacco: Never Used  Substance Use Topics  . Alcohol use: Not Currently  . Drug use: Not on file     Allergies   Patient has no known allergies.   Review of Systems Review of Systems  All other systems reviewed and are negative.    Physical Exam Updated Vital Signs BP 119/73 (BP Location: Left Arm)   Pulse 78   Temp 98.8 F (37.1 C) (Oral)   Resp 18   SpO2 100%   Physical Exam  Constitutional: She appears well-developed and well-nourished.  HENT:  Head: Normocephalic and atraumatic.  Musculoskeletal: Normal range of motion. She exhibits tenderness. She exhibits no edema or deformity.  There is to palpation on bilateral soles Dorsal pedals pulses intact bilaterally Toes are pink with good capillary refill Sensation intact  Skin: Skin is warm. Capillary refill takes less than 2 seconds.  Psychiatric: She has a normal mood and affect.  Nursing note and vitals reviewed.    ED Treatments / Results  Labs (all labs ordered are listed, but only abnormal results are displayed) Labs Reviewed  CBC - Abnormal; Notable for the following components:      Result Value   RBC 3.54 (*)    Hemoglobin  10.5 (*)    HCT 31.6 (*)    All other components within normal limits  BASIC METABOLIC PANEL - Abnormal; Notable for the following components:   Creatinine, Ser 1.08 (*)    Calcium 10.7 (*)    GFR calc non Af Amer 55 (*)    All other components within normal limits    EKG None  Radiology Dg Foot Complete Left  Result Date: 06/29/2018 CLINICAL DATA:  62 year old female with history of worsening bilateral foot pain and swelling over the past several days. Recently diagnosed with gout. EXAM: LEFT FOOT - COMPLETE 3+ VIEW COMPARISON:  No priors. FINDINGS: There is no evidence of fracture or dislocation. Mild degenerative changes of osteoarthritis are noted, most evident at the first MTP joint. Degenerative changes are  also noted at the tibiotalar joint, advanced, presumably related to remote trauma. There is no focal bone abnormality. Orthopedic plate and screw fixation device in the distal fibula incidentally noted. Soft tissues are unremarkable. IMPRESSION: 1. No acute abnormality of the left foot to account for the patient's symptoms. Electronically Signed   By: Vinnie Langton M.D.   On: 06/29/2018 12:29   Dg Foot Complete Right  Result Date: 06/29/2018 CLINICAL DATA:  Worsening foot pain and swelling EXAM: RIGHT FOOT COMPLETE - 3+ VIEW COMPARISON:  06/29/2018 FINDINGS: There is no evidence of fracture or dislocation. There is no evidence of arthropathy or other focal bone abnormality. Soft tissues are unremarkable. IMPRESSION: Negative. Electronically Signed   By: Jerilynn Mages.  Shick M.D.   On: 06/29/2018 12:16    Procedures Procedures (including critical care time)  Medications Ordered in ED Medications  oxyCODONE-acetaminophen (PERCOCET/ROXICET) 5-325 MG per tablet 2 tablet (2 tablets Oral Given 06/29/18 1225)     Initial Impression / Assessment and Plan / ED Course  I have reviewed the triage vital signs and the nursing notes.  Pertinent labs & imaging results that were available during my care of the patient were reviewed by me and considered in my medical decision making (see chart for details).    62 year old female who presents with bilateral foot pain most consistent with neuropathy.  She has normal lab work here with no evidence of fracture or infection.  Patient started on low-dose gabapentin and advised regarding need for close follow-up.  Final Clinical Impressions(s) / ED Diagnoses   Final diagnoses:  None    ED Discharge Orders    None       Pattricia Boss, MD 06/29/18 1331

## 2018-06-29 NOTE — ED Notes (Signed)
ED Provider at bedside. 

## 2018-06-29 NOTE — ED Triage Notes (Addendum)
Pt states has had worsening bilateral foot pain and swelling over the past couple of days. Pt states she was recently diagnosed with gout. Pt denies new injury. Pt was told to start taking gout medication once inflammation subsides.

## 2018-06-30 ENCOUNTER — Encounter (HOSPITAL_COMMUNITY): Payer: Self-pay | Admitting: Emergency Medicine

## 2018-11-19 ENCOUNTER — Other Ambulatory Visit: Payer: Self-pay | Admitting: Family Medicine

## 2018-11-19 DIAGNOSIS — Z1231 Encounter for screening mammogram for malignant neoplasm of breast: Secondary | ICD-10-CM

## 2018-12-17 ENCOUNTER — Ambulatory Visit
Admission: RE | Admit: 2018-12-17 | Discharge: 2018-12-17 | Disposition: A | Discharge status: Home / Self Care | Payer: Managed Care, Other (non HMO) | Source: Ambulatory Visit | Attending: Family Medicine | Admitting: Family Medicine

## 2018-12-17 DIAGNOSIS — Z1231 Encounter for screening mammogram for malignant neoplasm of breast: Secondary | ICD-10-CM

## 2019-04-15 ENCOUNTER — Encounter: Payer: Self-pay | Admitting: Orthopaedic Surgery

## 2019-04-15 ENCOUNTER — Ambulatory Visit: Payer: Managed Care, Other (non HMO) | Admitting: Orthopaedic Surgery

## 2019-04-15 ENCOUNTER — Other Ambulatory Visit: Payer: Self-pay

## 2019-04-15 ENCOUNTER — Ambulatory Visit (INDEPENDENT_AMBULATORY_CARE_PROVIDER_SITE_OTHER): Payer: Managed Care, Other (non HMO)

## 2019-04-15 VITALS — BP 130/75 | HR 69 | Ht 65.5 in | Wt 230.0 lb

## 2019-04-15 DIAGNOSIS — M25572 Pain in left ankle and joints of left foot: Secondary | ICD-10-CM

## 2019-04-15 DIAGNOSIS — M79672 Pain in left foot: Secondary | ICD-10-CM

## 2019-04-15 MED ORDER — LIDOCAINE HCL 1 % IJ SOLN
1.0000 mL | INTRAMUSCULAR | Status: AC | PRN
  Administered 2019-04-15: 1 mL

## 2019-04-15 MED ORDER — BUPIVACAINE HCL 0.5 % IJ SOLN
1.0000 mL | INTRAMUSCULAR | Status: AC | PRN
  Administered 2019-04-15: 1 mL via INTRA_ARTICULAR

## 2019-04-15 MED ORDER — METHYLPREDNISOLONE ACETATE 40 MG/ML IJ SUSP
40.0000 mg | INTRAMUSCULAR | Status: AC | PRN
  Administered 2019-04-15: 40 mg via INTRA_ARTICULAR

## 2019-04-15 NOTE — Progress Notes (Signed)
Office Visit Note   Patient: Julie Foster           Date of Birth: 26-Apr-1956           MRN: [id number] Visit Date: 04/15/2019              Requested by: No referring provider defined for this encounter. PCP: Julie Pepper, MD   Assessment & Plan: Visit Diagnoses:  1. Pain in left ankle and joints of left foot   2. Left foot pain     Plan: Posttraumatic osteoarthritis left ankle with the possibility of contribution per her history of gout.  Long discussion regarding diagnosis and treatment options.  Patient is also diabetic.  Will inject the ankle with cortisone.  [facility] to [facility] for consideration of bracing.  Also had discussion regarding ankle replacement.  Will return as needed  Follow-Up Instructions: Return if symptoms worsen or fail to improve.   Orders:  Orders Placed This Encounter  Procedures  . Medium Joint Inj  . XR Ankle Complete Left   No orders of the defined types were placed in this encounter.     Procedures: Medium Joint Inj on 04/15/2019 3:54 PM Indications: pain and joint swelling Details: 27 G needle, anterolateral approach Medications: 1 mL lidocaine 1 %; 1 mL bupivacaine 0.5 %; 40 mg methylPREDNISolone acetate 40 MG/ML      Clinical Data: No additional findings.   Subjective: Chief Complaint  Patient presents with  . Left Ankle - Pain  Patient presents today for left ankle pain. She said that she had surgery 10years ago with Julie Foster after she fractured her ankle. She has been doing well until last year. The pain has progressed over time. The pain is all throughout her ankle. She has swelling. She is taking Ibuprofen 800mg , but states that it does not help.  Works on her feet all day long which aggravates her pain.  She is diabetic.  No new injury or trauma  HPI  Review of Systems   Objective: Vital Signs: BP 130/75   Pulse 69   Ht 5' 5.5" (1.664 m)   Wt 230 lb (104.3 kg)   BMI 37.69 kg/m   Physical Exam  Constitutional:      Appearance: She is well-developed.  Eyes:     Pupils: Pupils are equal, round, and reactive to light.  Pulmonary:     Effort: Pulmonary effort is normal.  Skin:    General: Skin is warm and dry.  Neurological:     Mental Status: She is alert and oriented to person, place, and time.  Psychiatric:        Behavior: Behavior normal.     Ortho Exam left ankle with dorsiflexion to neutral.  About 25 degrees of plantarflexion.  Very minimal decreased subtalar motion.  Has well-healed lateral incisions longitudinally over the distal fibula and somewhat transversely over the medial malleolus.  There is some swelling medial to the lateral anterior ankle.  Some tenderness in the same area.  Foot is warm.  Did not feel pulses.  Specialty Comments:  No specialty comments available.  Imaging: Xr Ankle Complete Left  Result Date: 04/15/2019 Films of the left ankle were obtained in 3 projections.  There is evidence of old internal fixation of the distal fibula.  The plate and screws are in good position.  There is obvious arthritis of the ankle joint with narrowed joint space subchondral sclerosis and subchondral cysts on both sides of the joint.  There is some calcification between the distal fibula and tibia and may have some old evidence of diastases.    PMFS History: Patient Active Problem List   Diagnosis Date Noted  . Hypercholesteremia    Past Medical History:  Diagnosis Date  . Diabetes mellitus without complication ([place])   . Gout   . Hypercholesteremia   . Hypertension     History reviewed. No pertinent family history.  Past Surgical History:  Procedure Laterality Date  . FRACTURE SURGERY    . ORIF ANKLE FRACTURE Left    Social History   Occupational History  . Not on file  Tobacco Use  . Smoking status: Never Smoker  . Smokeless tobacco: Never Used  Substance and Sexual Activity  . Alcohol use: Not Currently  . Drug use: Not on file  . Sexual  activity: Not on file

## 2019-05-03 DIAGNOSIS — M5416 Radiculopathy, lumbar region: Secondary | ICD-10-CM | POA: Insufficient documentation

## 2019-06-08 ENCOUNTER — Encounter: Payer: Self-pay | Admitting: Internal Medicine

## 2019-07-08 ENCOUNTER — Ambulatory Visit (INDEPENDENT_AMBULATORY_CARE_PROVIDER_SITE_OTHER): Payer: Managed Care, Other (non HMO) | Admitting: Orthopaedic Surgery

## 2019-07-08 ENCOUNTER — Other Ambulatory Visit: Payer: Self-pay

## 2019-07-08 ENCOUNTER — Encounter: Payer: Self-pay | Admitting: Orthopaedic Surgery

## 2019-07-08 DIAGNOSIS — M19172 Post-traumatic osteoarthritis, left ankle and foot: Secondary | ICD-10-CM | POA: Diagnosis not present

## 2019-07-08 MED ORDER — METHYLPREDNISOLONE ACETATE 40 MG/ML IJ SUSP
40.0000 mg | INTRAMUSCULAR | Status: AC | PRN
  Administered 2019-07-08: 40 mg via INTRA_ARTICULAR

## 2019-07-08 MED ORDER — LIDOCAINE HCL 1 % IJ SOLN
1.0000 mL | INTRAMUSCULAR | Status: AC | PRN
  Administered 2019-07-08: 1 mL

## 2019-07-08 NOTE — Progress Notes (Signed)
Office Visit Note   Patient: Julie Foster           Date of Birth: [birth year]           MRN: [id number] Visit Date: 07/08/2019              Requested by: London Pepper, MD [address],  [place] [address] PCP: London Pepper, MD   Assessment & Plan: Visit Diagnoses:  1. Post-traumatic osteoarthritis, left ankle and foot     Plan: Posttraumatic osteoarthritis left ankle from injury over 10 years ago.  Had a cortisone injection 3 months ago with good relief.  Has had recurrent pain and wishes to have another injection.  Will reinject the ankle with cortisone and monitor response.  Might be a candidate for ankle replacement over time  Follow-Up Instructions: Return if symptoms worsen or fail to improve.   Orders:  Orders Placed This Encounter  Procedures  . Medium Joint Inj: L ankle   No orders of the defined types were placed in this encounter.     Procedures: Medium Joint Inj: L ankle on 07/08/2019 11:01 AM Details: 25 G 1.5 in needle, anterolateral approach Medications: 1 mL lidocaine 1 %; 40 mg methylPREDNISolone acetate 40 MG/ML      Clinical Data: No additional findings.   Subjective: Chief Complaint  Patient presents with  . Left Ankle - Pain  Patient presents in the office today with left ankle pain. Patient states she had left ankle surgery about 10 years ago by Dr. Durward Fortes. Patient states she had an xray done and a cortisone injection 3 months ago and noticed significant improvement. Patient is requesting another left ankle injection today.   HPI  Review of Systems  Constitutional: Negative for fatigue and fever.  HENT: Negative for sore throat and tinnitus.   Eyes: Negative for redness.  Respiratory: Negative for shortness of breath.   Cardiovascular: Negative for chest pain.  Gastrointestinal: Negative for constipation and diarrhea.  Endocrine: Negative for polyuria.  Genitourinary: Negative for pelvic  pain.  Musculoskeletal: Negative for myalgias.  Skin: Negative for rash.  Allergic/Immunologic: Negative for immunocompromised state.  Neurological: Negative for dizziness and headaches.  Hematological: Does not bruise/bleed easily.  Psychiatric/Behavioral: Negative for confusion.     Objective: Vital Signs: BP 132/88 (BP Location: Right Arm, Patient Position: Sitting, Cuff Size: Large)   Pulse 77   Resp 14   Ht 5' 5.5" (1.664 m)   Wt 217 lb (98.4 kg)   BMI 35.56 kg/m   Physical Exam  Ortho Exam chronic swelling about the left ankle without change from prior evaluation.  No change in range of motion.  Skin foot was warm.  Motor is intact.  Specialty Comments:  No specialty comments available.  Imaging: No results found.   PMFS History: Patient Active Problem List   Diagnosis Date Noted  . Post-traumatic osteoarthritis, left ankle and foot 07/08/2019  . Hypercholesteremia    Past Medical History:  Diagnosis Date  . Diabetes mellitus without complication ([place])   . Gout   . Hypercholesteremia   . Hypertension     History reviewed. No pertinent family history.  Past Surgical History:  Procedure Laterality Date  . FRACTURE SURGERY    . ORIF ANKLE FRACTURE Left    Social History   Occupational History  . Not on file  Tobacco Use  . Smoking status: Never Smoker  . Smokeless tobacco: Never Used  Substance and Sexual Activity  .  Alcohol use: Not Currently  . Drug use: Not on file  . Sexual activity: Not on file

## 2019-11-25 ENCOUNTER — Encounter: Payer: Self-pay | Admitting: Orthopaedic Surgery

## 2019-11-25 ENCOUNTER — Other Ambulatory Visit: Payer: Self-pay

## 2019-11-25 ENCOUNTER — Ambulatory Visit: Payer: Managed Care, Other (non HMO) | Admitting: Orthopaedic Surgery

## 2019-11-25 VITALS — Ht 65.5 in | Wt 218.0 lb

## 2019-11-25 DIAGNOSIS — M19172 Post-traumatic osteoarthritis, left ankle and foot: Secondary | ICD-10-CM | POA: Diagnosis not present

## 2019-11-25 MED ORDER — METHYLPREDNISOLONE ACETATE 40 MG/ML IJ SUSP
40.0000 mg | INTRAMUSCULAR | Status: AC | PRN
  Administered 2019-11-25: 40 mg via INTRA_ARTICULAR

## 2019-11-25 MED ORDER — LIDOCAINE HCL 1 % IJ SOLN
1.0000 mL | INTRAMUSCULAR | Status: AC | PRN
  Administered 2019-11-25: 1 mL

## 2019-11-25 NOTE — Progress Notes (Signed)
Office Visit Note   Patient: Julie Foster           Date of Birth: Jan 18, 1956           MRN: [id number] Visit Date: 11/25/2019              Requested by: London Pepper, MD [address],  [place] [address] PCP: London Pepper, MD   Assessment & Plan: Visit Diagnoses:  1. Post-traumatic osteoarthritis, left ankle and foot     Plan: Julie Foster has posttraumatic osteoarthritis of the left ankle and has done well in the past with cortisone injections.  Last injection was about 3 months ago.  I will repeat the injection today.  She is also been to the brace shop and did not want to consider a brace after that evaluation.  We will plan to see back as needed  Follow-Up Instructions: Return if symptoms worsen or fail to improve.   Orders:  Orders Placed This Encounter  Procedures  . Medium Joint Inj   No orders of the defined types were placed in this encounter.     Procedures: Medium Joint Inj on 11/25/2019 4:05 PM Details: 27 G needle, anterolateral approach Medications: 1 mL lidocaine 1 %; 40 mg methylPREDNISolone acetate 40 MG/ML      Clinical Data: No additional findings.   Subjective: Chief Complaint  Patient presents with  . Left Foot - Pain  Patient presents today for left ankle pain. She was here in September of 2020 and received a cortisone injection. She is wanting to get another injection today for arthritis in her ankle. She isn't taking anything for pain because nothing helps.   HPI  Review of Systems   Objective: Vital Signs: Ht 5' 5.5" (1.664 m)   Wt 218 lb (98.9 kg)   BMI 35.73 kg/m   Physical Exam Constitutional:      Appearance: She is well-developed.  Eyes:     Pupils: Pupils are equal, round, and reactive to light.  Pulmonary:     Effort: Pulmonary effort is normal.  Skin:    General: Skin is warm and dry.  Neurological:     Mental Status: She is alert and oriented to person, place, and time.    Psychiatric:        Behavior: Behavior normal.     Ortho Exam awake alert and oriented x3.  Walk with a slight limp referable to her left ankle.  There was some hypertrophic changes and some swelling about the ankle diffusely and and anteriorly.  Most of the pain was along the anterior lateral ankle.  Limited range of motion as previously identified in both subtalar motion and ankle motion.  Does pronate.  Specialty Comments:  No specialty comments available.  Imaging: No results found.   PMFS History: Patient Active Problem List   Diagnosis Date Noted  . Post-traumatic osteoarthritis, left ankle and foot 07/08/2019  . Hypercholesteremia    Past Medical History:  Diagnosis Date  . Diabetes mellitus without complication ([place])   . Gout   . Hypercholesteremia   . Hypertension     History reviewed. No pertinent family history.  Past Surgical History:  Procedure Laterality Date  . FRACTURE SURGERY    . ORIF ANKLE FRACTURE Left    Social History   Occupational History  . Not on file  Tobacco Use  . Smoking status: Never Smoker  . Smokeless tobacco: Never Used  Substance and Sexual Activity  .  Alcohol use: Not Currently  . Drug use: Not on file  . Sexual activity: Not on file

## 2019-12-20 ENCOUNTER — Other Ambulatory Visit: Payer: Self-pay

## 2019-12-20 ENCOUNTER — Ambulatory Visit: Payer: Self-pay

## 2019-12-20 ENCOUNTER — Encounter: Payer: Self-pay | Admitting: Family Medicine

## 2019-12-20 ENCOUNTER — Ambulatory Visit: Payer: Managed Care, Other (non HMO) | Admitting: Family Medicine

## 2019-12-20 DIAGNOSIS — M19172 Post-traumatic osteoarthritis, left ankle and foot: Secondary | ICD-10-CM

## 2019-12-20 NOTE — Progress Notes (Signed)
   Office Visit Note   Patient: Julie Foster           Date of Birth: 04/02/1956           MRN: [id number] Visit Date: 12/20/2019 Requested by: London Pepper, MD [address] [address],  [place] [address] PCP: London Pepper, MD  Subjective: Chief Complaint  Patient presents with  . Left Ankle - Pain    Persistent pain in the ankle (medial and anterior). The cortisone injection 11/25/19 from Dr. Durward Fortes, did not help this time. Plans to get her ankle brace soon (from [employer]).    HPI: She is here with persistent left ankle pain.  She has DJD approximately 11 years status post fracture ORIF.  She has had 3 injections but the last injection did not help like the first 2.  She is having a lot of trouble walking.              ROS:   All other systems were reviewed and are negative.  Objective: Vital Signs: There were no vitals taken for this visit.  Physical Exam:  General:  Alert and oriented, in no acute distress. Pulm:  Breathing unlabored. Psy:  Normal mood, congruent affect. Skin: No erythema or rash. Left ankle: She is very tender to palpation over the anterolateral ankle joint line.  She has limited active and passive range of motion.  Imaging: None today  Assessment & Plan: 1.  Persistent left ankle pain due to DJD -Discussed options with her and elected to inject 1 more time under ultrasound guidance.  If this does not help, then new x-rays.     Procedures: Left ankle ultrasound-guided steroid injection: After sterile prep with Betadine, injected 5 cc 1% lidocaine without epinephrine and 40 mg methylprednisolone into the anterior lateral ankle joint using ultrasound to guide needle placement into the joint recess.    PMFS History: Patient Active Problem List   Diagnosis Date Noted  . Post-traumatic osteoarthritis, left ankle and foot 07/08/2019  . Lumbar radiculopathy 05/03/2019  . Hypercholesteremia    Past Medical History:    Diagnosis Date  . Diabetes mellitus without complication ([place])   . Gout   . Hypercholesteremia   . Hypertension     History reviewed. No pertinent family history.  Past Surgical History:  Procedure Laterality Date  . FRACTURE SURGERY    . ORIF ANKLE FRACTURE Left    Social History   Occupational History  . Not on file  Tobacco Use  . Smoking status: Never Smoker  . Smokeless tobacco: Never Used  Substance and Sexual Activity  . Alcohol use: Not Currently  . Drug use: Not on file  . Sexual activity: Not on file

## 2019-12-23 ENCOUNTER — Telehealth: Payer: Self-pay | Admitting: Orthopaedic Surgery

## 2019-12-23 NOTE — Telephone Encounter (Signed)
Is this just FYI?

## 2019-12-23 NOTE — Telephone Encounter (Signed)
Patient called advised she would like to continue to see Dr Junius Roads for her care. The number to contact patient is [phone]

## 2019-12-23 NOTE — Telephone Encounter (Signed)
Hey..please see below.

## 2019-12-28 ENCOUNTER — Telehealth: Payer: Self-pay | Admitting: Family Medicine

## 2019-12-28 NOTE — Telephone Encounter (Signed)
Spoke with patient advised she can see Dr Junius Roads per her request

## 2020-01-10 ENCOUNTER — Ambulatory Visit: Payer: Managed Care, Other (non HMO) | Attending: Internal Medicine

## 2020-01-10 DIAGNOSIS — Z23 Encounter for immunization: Secondary | ICD-10-CM

## 2020-01-10 NOTE — Progress Notes (Signed)
   Covid-19 Vaccination Clinic  Name:  Julie Foster    MRN: [id number] DOB: October 31, 1955  01/10/2020  [name] was observed post Covid-19 immunization for 15 minutes without incident. She was provided with Vaccine Information Sheet and instruction to access the V-Safe system.   [name] was instructed to call 911 with any severe reactions post vaccine: Marland Kitchen Difficulty breathing  . Swelling of face and throat  . A fast heartbeat  . A bad rash all over body  . Dizziness and weakness   Immunizations Administered    Name Date Dose VIS Date Route   Pfizer COVID-19 Vaccine 01/10/2020  1:47 PM 0.3 mL 10/08/2019 Intramuscular   Manufacturer: [place]   Lot: [id number]   [place]: [id number]

## 2020-02-02 ENCOUNTER — Ambulatory Visit: Payer: Managed Care, Other (non HMO) | Attending: Internal Medicine

## 2020-02-02 DIAGNOSIS — Z23 Encounter for immunization: Secondary | ICD-10-CM

## 2020-02-02 NOTE — Progress Notes (Signed)
   Covid-19 Vaccination Clinic  Name:  Fredricka Waterford    MRN: [id number] DOB: 11-19-1955  02/02/2020  Ms. Ruthenberg was observed post Covid-19 immunization for 15 minutes without incident. She was provided with Vaccine Information Sheet and instruction to access the V-Safe system.   Ms. Deutschman was instructed to call 911 with any severe reactions post vaccine: Marland Kitchen Difficulty breathing  . Swelling of face and throat  . A fast heartbeat  . A bad rash all over body  . Dizziness and weakness   Immunizations Administered    Name Date Dose VIS Date Route   Pfizer COVID-19 Vaccine 02/02/2020 12:54 PM 0.3 mL 10/08/2019 Intramuscular   Manufacturer: [place]   Lot: [id number]   [place]: [id number]

## 2020-02-17 ENCOUNTER — Encounter: Payer: Self-pay | Admitting: Family Medicine

## 2020-02-17 ENCOUNTER — Ambulatory Visit: Payer: Managed Care, Other (non HMO) | Admitting: Family Medicine

## 2020-02-17 ENCOUNTER — Other Ambulatory Visit: Payer: Self-pay

## 2020-02-17 DIAGNOSIS — M19172 Post-traumatic osteoarthritis, left ankle and foot: Secondary | ICD-10-CM | POA: Diagnosis not present

## 2020-02-17 NOTE — Progress Notes (Signed)
   Office Visit Note   Patient: Julie Foster           Date of Birth: 02/26/1956           MRN: [id number] Visit Date: 02/17/2020 Requested by: London Pepper, MD [address],  [place] [address] PCP: London Pepper, MD  Subjective: Chief Complaint  Patient presents with  . Left Ankle - Pain    Ankle does feel better (less pain) after the cortisone injection 12/20/19. Needs a new Rx for an ankle brace to [facility] (her last one expired) - for a double-upright brace that fastens to her diabetic work shoe.    HPI: She is here with persistent left ankle pain.  She did get some relief with injection in February but it still hurts on a daily basis.  At work, if she works more than 5 days in a row her ankle hurts substantially.  Sometimes her employer requires working more than 7 days in a row.  She would like a note to limit her work days.  She also needs a new brace for her ankle.              ROS:   All other systems were reviewed and are negative.  Objective: Vital Signs: There were no vitals taken for this visit.  Physical Exam:  General:  Alert and oriented, in no acute distress. Pulm:  Breathing unlabored. Psy:  Normal mood, congruent affect.  Left ankle: She remains tender to palpation around the anterior ankle joint diffusely.  She has a little pain laterally as well.  Imaging: No results found.  Assessment & Plan: 1.  Left ankle osteoarthritis -Work note given, prescription for a new brace at [facility]. -In the next month or 2, if pain is not any better we can repeat ankle injection.  Could contemplate dextrose prolotherapy if she does not get longer relief from cortisone.     Procedures: No procedures performed  No notes on file     PMFS History: Patient Active Problem List   Diagnosis Date Noted  . Post-traumatic osteoarthritis, left ankle and foot 07/08/2019  . Lumbar radiculopathy 05/03/2019  . Hypercholesteremia    Past  Medical History:  Diagnosis Date  . Diabetes mellitus without complication ([place] [place])   . Gout   . Hypercholesteremia   . Hypertension     History reviewed. No pertinent family history.  Past Surgical History:  Procedure Laterality Date  . FRACTURE SURGERY    . ORIF ANKLE FRACTURE Left    Social History   Occupational History  . Not on file  Tobacco Use  . Smoking status: Never Smoker  . Smokeless tobacco: Never Used  Substance and Sexual Activity  . Alcohol use: Not Currently  . Drug use: Not on file  . Sexual activity: Not on file

## 2020-04-14 ENCOUNTER — Other Ambulatory Visit (HOSPITAL_COMMUNITY)
Admission: RE | Admit: 2020-04-14 | Discharge: 2020-04-14 | Disposition: A | Discharge status: Home / Self Care | Payer: Managed Care, Other (non HMO) | Source: Ambulatory Visit | Attending: Family Medicine | Admitting: Family Medicine

## 2020-04-14 ENCOUNTER — Other Ambulatory Visit: Payer: Self-pay | Admitting: Family Medicine

## 2020-04-14 DIAGNOSIS — Z01411 Encounter for gynecological examination (general) (routine) with abnormal findings: Secondary | ICD-10-CM | POA: Insufficient documentation

## 2020-04-18 LAB — CYTOLOGY - PAP
Comment: NEGATIVE
Diagnosis: NEGATIVE
High risk HPV: NEGATIVE

## 2020-04-19 ENCOUNTER — Ambulatory Visit: Payer: Managed Care, Other (non HMO) | Admitting: Family Medicine

## 2020-04-19 ENCOUNTER — Encounter: Payer: Self-pay | Admitting: Family Medicine

## 2020-04-19 ENCOUNTER — Ambulatory Visit: Payer: Self-pay

## 2020-04-19 ENCOUNTER — Other Ambulatory Visit: Payer: Self-pay

## 2020-04-19 DIAGNOSIS — M19172 Post-traumatic osteoarthritis, left ankle and foot: Secondary | ICD-10-CM | POA: Diagnosis not present

## 2020-04-19 DIAGNOSIS — M25572 Pain in left ankle and joints of left foot: Secondary | ICD-10-CM

## 2020-04-19 NOTE — Progress Notes (Signed)
   Office Visit Note   Patient: Julie Foster           Date of Birth: 02-Nov-1955           MRN: [id number] Visit Date: 04/19/2020 Requested by: London Pepper, MD [place] [address] [place] [address] [place],  [place] [address] PCP: London Pepper, MD  Subjective: Chief Complaint  Patient presents with  . Left Ankle - Pain    Requesting another cortisone injection. Last injection was 12/20/2019.  . bil hand swelling/numbness at night x 2 months    HPI: She is here with recurrent left ankle pain and now with bilateral hand swelling and pain as well as numbness.  Her ankle DJD did fairly well after injection in February but it only lasted a couple months.  She had some issues getting a brace from [employer] but now has another location in mind and needs a prescription for it.  Her hands have been swollen and intermittently numb for the past month or 2.  No injury, no change in her activities, no change in her medications.  She recently had labs drawn by her PCP which were unrevealing.  She wakes up every morning with numbness in her hands, it goes away pretty quickly.               ROS: No fevers or chills.  All other systems were reviewed and are negative.  Objective: Vital Signs: There were no vitals taken for this visit.  Physical Exam:  General:  Alert and oriented, in no acute distress. Pulm:  Breathing unlabored. Psy:  Normal mood, congruent affect. Skin: No rash Hands: She has slight soft tissue swelling in her fingers.  She has mildly positive Tinel's at the carpal tunnel bilaterally and positive Phalen's test on both sides.  No thenar atrophy. Left ankle: Tender to palpation over the anterolateral joint line.  Slight effusion this morning with no warmth or erythema.  Imaging: No results found.  Assessment & Plan: 1.  Left ankle DJD -We will reinject today under ultrasound guidance.  If this does not give lasting relief, we will consider dextrose prolotherapy next  time. -She will proceed with getting an ankle brace as well.  2.  Bilateral hand swelling and numbness, etiology unclear but could be carpal tunnel syndrome. -We will try carpal tunnel night splints for the next 4 to 6 weeks.  If symptoms persist, then nerve conduction studies.        PMFS History: Patient Active Problem List   Diagnosis Date Noted  . Post-traumatic osteoarthritis, left ankle and foot 07/08/2019  . Lumbar radiculopathy 05/03/2019  . Hypercholesteremia    Past Medical History:  Diagnosis Date  . Diabetes mellitus without complication ([place])   . Gout   . Hypercholesteremia   . Hypertension     History reviewed. No pertinent family history.  Past Surgical History:  Procedure Laterality Date  . FRACTURE SURGERY    . ORIF ANKLE FRACTURE Left    Social History   Occupational History  . Not on file  Tobacco Use  . Smoking status: Never Smoker  . Smokeless tobacco: Never Used  Substance and Sexual Activity  . Alcohol use: Not Currently  . Drug use: Not on file  . Sexual activity: Not on file

## 2020-05-16 ENCOUNTER — Telehealth: Payer: Self-pay | Admitting: Family Medicine

## 2020-05-16 NOTE — Telephone Encounter (Signed)
Ov notes 01/27/2020-present faxed to [facility] to support need to brace

## 2020-06-20 ENCOUNTER — Telehealth: Payer: Self-pay

## 2020-06-20 NOTE — Telephone Encounter (Signed)
Patient called stating that her ankle is very swollen and that she needs to be seen today.  Cb# [phone].  Please advise.  Thank you.

## 2020-06-20 NOTE — Telephone Encounter (Signed)
I called the patient. She had called back and scheduled an appointment for tomorrow afternoon with Dr. Junius Roads. She said the ankle has locked up on her and she has not been able to go to work this week. I offered to work the patient in this afternoon ---- she chose to keep that appointment tomorrow.

## 2020-06-21 ENCOUNTER — Encounter: Payer: Self-pay | Admitting: Family Medicine

## 2020-06-21 ENCOUNTER — Ambulatory Visit: Payer: Managed Care, Other (non HMO) | Admitting: Family Medicine

## 2020-06-21 ENCOUNTER — Other Ambulatory Visit: Payer: Self-pay

## 2020-06-21 DIAGNOSIS — M19172 Post-traumatic osteoarthritis, left ankle and foot: Secondary | ICD-10-CM

## 2020-06-21 MED ORDER — HYDROCODONE-ACETAMINOPHEN 5-325 MG PO TABS: 1.0000 | ORAL_TABLET | Freq: Four times a day (QID) | ORAL | 0 refills | Status: DC | PRN

## 2020-06-21 NOTE — Progress Notes (Signed)
   Office Visit Note   Patient: Julie Foster           Date of Birth: Aug 07, 1956           MRN: [id number] Visit Date: 06/21/2020 Requested by: London Pepper, MD [place] [address] [place],  [place] [address] PCP: London Pepper, MD  Subjective: Chief Complaint  Patient presents with  . Left Ankle - Pain    Has gotten the brace for her ankle, attaches to her shoe. She said her ankle locked up on her 2 mornings ago. Wearing ankle sleeve. Pain has been so bad she has not worked this week.     HPI: She is here with flareup of left ankle pain.  She has end-stage DJD.  Injections are only getting about 2 months of relief at best.  Recently her ankle "locked up".  She has missed work since Monday because of her pain.  She does not have anything to take other than over-the-counter meds.              ROS:   All other systems were reviewed and are negative.  Objective: Vital Signs: There were no vitals taken for this visit.  Physical Exam:  General:  Alert and oriented, in no acute distress. Pulm:  Breathing unlabored. Psy:  Normal mood, congruent affect. Skin: No erythema Left ankle: She has 1+ effusion with no warmth.  Tender across anterior lateral joint line.  Imaging: No results found.  Assessment & Plan: 1.  Flareup of left ankle DJD -Work note given.  Prescription for hydrocodone in case pain is severe. -If she fails to improve we will inject with dextrose.     Procedures: No procedures performed  No notes on file     PMFS History: Patient Active Problem List   Diagnosis Date Noted  . Post-traumatic osteoarthritis, left ankle and foot 07/08/2019  . Lumbar radiculopathy 05/03/2019  . Hypercholesteremia    Past Medical History:  Diagnosis Date  . Diabetes mellitus without complication ([place])   . Gout   . Hypercholesteremia   . Hypertension     History reviewed. No pertinent family history.  Past Surgical History:  Procedure  Laterality Date  . FRACTURE SURGERY    . ORIF ANKLE FRACTURE Left    Social History   Occupational History  . Not on file  Tobacco Use  . Smoking status: Never Smoker  . Smokeless tobacco: Never Used  Substance and Sexual Activity  . Alcohol use: Not Currently  . Drug use: Not on file  . Sexual activity: Not on file

## 2020-07-21 ENCOUNTER — Ambulatory Visit: Payer: Managed Care, Other (non HMO) | Admitting: Family Medicine

## 2020-07-21 ENCOUNTER — Ambulatory Visit: Payer: Self-pay

## 2020-07-21 ENCOUNTER — Other Ambulatory Visit: Payer: Self-pay

## 2020-07-21 ENCOUNTER — Encounter: Payer: Self-pay | Admitting: Family Medicine

## 2020-07-21 DIAGNOSIS — M19172 Post-traumatic osteoarthritis, left ankle and foot: Secondary | ICD-10-CM

## 2020-07-21 DIAGNOSIS — M25572 Pain in left ankle and joints of left foot: Secondary | ICD-10-CM | POA: Diagnosis not present

## 2020-07-21 NOTE — Progress Notes (Signed)
   Office Visit Note   Patient: Julie Foster           Date of Birth: 03-21-1956           MRN: [id number] Visit Date: 07/21/2020 Requested by: London Pepper, MD [place] [address] [place],  [place] [address] PCP: London Pepper, MD  Subjective: Chief Complaint  Patient presents with  . Left Ankle - Pain    Dextrose ankle injection. The cortisone injection 04/19/20 did help for almost 3 months. Ankle has not locked up again since 06/21/20 office visit.    HPI: She is here with persistent left ankle pain.  Cortisone injection helped for couple months, he has been progressively worsening in the past few weeks.              ROS:   All other systems were reviewed and are negative.  Objective: Vital Signs: There were no vitals taken for this visit.  Physical Exam:  General:  Alert and oriented, in no acute distress. Pulm:  Breathing unlabored. Psy:  Normal mood, congruent affect. Skin: No erythema Left ankle: Maximal tenderness is over the anterior lateral ankle joint line.  Very limited range of motion.  Imaging: US Guided Needle Placement - No Linked Charges  Result Date: 07/21/2020 Ultrasound-guided left ankle injection: After sterile prep with Betadine, injected 6 cc of a solution containing 6 cc 1% lidocaine without epinephrine and 4 cc of 50% dextrose into the lateral joint recess.   Assessment & Plan: 1.  Left ankle osteoarthritis -Dextrose injection as above.  Follow-up as needed.     Procedures: No procedures performed  No notes on file     PMFS History: Patient Active Problem List   Diagnosis Date Noted  . Post-traumatic osteoarthritis, left ankle and foot 07/08/2019  . Lumbar radiculopathy 05/03/2019  . Hypercholesteremia    Past Medical History:  Diagnosis Date  . Diabetes mellitus without complication ([place])   . Gout   . Hypercholesteremia   . Hypertension     No family history on file.  Past Surgical History:  Procedure  Laterality Date  . FRACTURE SURGERY    . ORIF ANKLE FRACTURE Left    Social History   Occupational History  . Not on file  Tobacco Use  . Smoking status: Never Smoker  . Smokeless tobacco: Never Used  Substance and Sexual Activity  . Alcohol use: Not Currently  . Drug use: Not on file  . Sexual activity: Not on file

## 2020-07-21 NOTE — Patient Instructions (Signed)
   Glucosamine Sulfate:  1,000 mg twice daily  Turmeric:  500 mg twice daily   

## 2020-09-13 ENCOUNTER — Telehealth: Payer: Self-pay

## 2020-09-13 NOTE — Telephone Encounter (Signed)
Please advise. She had a dextrose injection 06/2420.

## 2020-09-13 NOTE — Telephone Encounter (Signed)
I called and advised the patient. Offered to schedule an appointment for her - she said she would call back to do this.

## 2020-09-13 NOTE — Telephone Encounter (Signed)
We can do those every 3-4 weeks if needed.

## 2020-09-13 NOTE — Telephone Encounter (Signed)
Patient called she stated she needs another injection in her foot she wants to know its time to received one. CB:[phone]

## 2020-09-15 ENCOUNTER — Encounter: Payer: Self-pay | Admitting: Family Medicine

## 2020-09-15 ENCOUNTER — Ambulatory Visit (INDEPENDENT_AMBULATORY_CARE_PROVIDER_SITE_OTHER): Payer: Managed Care, Other (non HMO) | Admitting: Family Medicine

## 2020-09-15 ENCOUNTER — Other Ambulatory Visit: Payer: Self-pay

## 2020-09-15 ENCOUNTER — Ambulatory Visit: Payer: Self-pay

## 2020-09-15 DIAGNOSIS — M19172 Post-traumatic osteoarthritis, left ankle and foot: Secondary | ICD-10-CM | POA: Diagnosis not present

## 2020-09-15 NOTE — Progress Notes (Signed)
   Office Visit Note   Patient: Julie Foster           Date of Birth: 1956/07/30           MRN: [id number] Visit Date: 09/15/2020 Requested by: London Pepper, MD [place] [address],  [place] [address] PCP: London Pepper, MD  Subjective: Chief Complaint  Patient presents with  . Right Ankle - Pain    Would like another dextrose injection    HPI: She is here with worsening left ankle pain.  Dextrose injection in September helped pretty well, it has worn off in the past couple weeks.  She would like to try it again.                ROS:   All other systems were reviewed and are negative.  Objective: Vital Signs: There were no vitals taken for this visit.  Physical Exam:  General:  Alert and oriented, in no acute distress. Pulm:  Breathing unlabored. Psy:  Normal mood, congruent affect. Skin: No erythema or warmth Left ankle: She is tender primarily over the anterior lateral joint line.  Imaging: US Guided Needle Placement  Result Date: 09/15/2020 Ultrasound-guided left ankle injection: After sterile prep with Betadine, injected 4 cc of a solution containing 3 cc 1% lidocaine without epinephrine and 2 cc 50% dextrose into the lateral joint recess without complication.   Assessment & Plan: 1.  Left ankle osteoarthritis -Dextrose injection given today as above.  Follow-up as needed.     Procedures: No procedures performed  No notes on file     PMFS History: Patient Active Problem List   Diagnosis Date Noted  . Post-traumatic osteoarthritis, left ankle and foot 07/08/2019  . Lumbar radiculopathy 05/03/2019  . Hypercholesteremia    Past Medical History:  Diagnosis Date  . Diabetes mellitus without complication ([place])   . Gout   . Hypercholesteremia   . Hypertension     History reviewed. No pertinent family history.  Past Surgical History:  Procedure Laterality Date  . FRACTURE SURGERY    . ORIF ANKLE FRACTURE Left     Social History   Occupational History  . Not on file  Tobacco Use  . Smoking status: Never Smoker  . Smokeless tobacco: Never Used  Substance and Sexual Activity  . Alcohol use: Not Currently  . Drug use: Not on file  . Sexual activity: Not on file

## 2020-11-23 ENCOUNTER — Ambulatory Visit: Payer: Self-pay

## 2020-11-23 ENCOUNTER — Other Ambulatory Visit: Payer: Self-pay

## 2020-11-23 ENCOUNTER — Encounter: Payer: Self-pay | Admitting: Family Medicine

## 2020-11-23 ENCOUNTER — Ambulatory Visit (INDEPENDENT_AMBULATORY_CARE_PROVIDER_SITE_OTHER): Payer: Managed Care, Other (non HMO) | Admitting: Family Medicine

## 2020-11-23 DIAGNOSIS — M19172 Post-traumatic osteoarthritis, left ankle and foot: Secondary | ICD-10-CM

## 2020-11-23 NOTE — Progress Notes (Signed)
   Office Visit Note   Patient: Julie Foster           Date of Birth: 02-13-1956           MRN: [id number] Visit Date: 11/23/2020 Requested by: London Pepper, MD [place] [address],  [place] [address] PCP: London Pepper, MD  Subjective: Chief Complaint  Patient presents with  . Left Ankle - Pain    Requests injection, cortisone vs dextrose    HPI: He is here with a flareup of left ankle pain.  Dextrose injection just as much as cortisone, about 2 months.  She would like to do it again.              ROS:   All other systems were reviewed and are negative.  Objective: Vital Signs: There were no vitals taken for this visit.  Physical Exam:  General:  Alert and oriented, in no acute distress. Pulm:  Breathing unlabored. Psy:  Normal mood, congruent affect. Skin: No erythema Left ankle: Tender to palpation around the lateral joint line.  Imaging: US Guided Needle Placement - No Linked Charges  Result Date: 11/23/2020 Ultrasound-guided left ankle injection: After sterile prep with Betadine, injected 4 cc of a solution containing 3 cc 1% lidocaine without epinephrine and 2 cc 50% dextrose into the lateral joint recess without complication.   Assessment & Plan: 1.  Left ankle DJD -Dextrose injection given as above.  Follow-up as needed.     Procedures: No procedures performed        PMFS History: Patient Active Problem List   Diagnosis Date Noted  . Post-traumatic osteoarthritis, left ankle and foot 07/08/2019  . Lumbar radiculopathy 05/03/2019  . Hypercholesteremia    Past Medical History:  Diagnosis Date  . Diabetes mellitus without complication ([place])   . Gout   . Hypercholesteremia   . Hypertension     History reviewed. No pertinent family history.  Past Surgical History:  Procedure Laterality Date  . FRACTURE SURGERY    . ORIF ANKLE FRACTURE Left    Social History   Occupational History  . Not on file  Tobacco Use   . Smoking status: Never Smoker  . Smokeless tobacco: Never Used  Substance and Sexual Activity  . Alcohol use: Not Currently  . Drug use: Not on file  . Sexual activity: Not on file

## 2021-01-16 ENCOUNTER — Ambulatory Visit: Payer: Self-pay

## 2021-01-16 ENCOUNTER — Encounter: Payer: Self-pay | Admitting: Family Medicine

## 2021-01-16 ENCOUNTER — Other Ambulatory Visit: Payer: Self-pay

## 2021-01-16 ENCOUNTER — Ambulatory Visit (INDEPENDENT_AMBULATORY_CARE_PROVIDER_SITE_OTHER): Payer: Managed Care, Other (non HMO) | Admitting: Family Medicine

## 2021-01-16 DIAGNOSIS — M19172 Post-traumatic osteoarthritis, left ankle and foot: Secondary | ICD-10-CM | POA: Diagnosis not present

## 2021-01-16 DIAGNOSIS — M25572 Pain in left ankle and joints of left foot: Secondary | ICD-10-CM

## 2021-01-16 NOTE — Progress Notes (Signed)
   Office Visit Note   Patient: Julie Foster           Date of Birth: 12/07/1955           MRN: [id number] Visit Date: 01/16/2021 Requested by: London Pepper, MD [place] [address],  [place] [address] PCP: London Pepper, MD  Subjective: Chief Complaint  Patient presents with  . Left Ankle - Pain    Requesting cortisone injection    HPI: 65yo F with chronic left ankle pain, presenting for L ankle steroid injection. Patient has had multiple injections in the past, and states that these typically offer short-lived, but significant relief. She has no acute concerns today.               ROS:   All other systems were reviewed and are negative.  Objective: Vital Signs: There were no vitals taken for this visit.  Physical Exam:  General:  Alert and oriented, in no acute distress. Pulm:  Breathing unlabored. Psy:  Normal mood, congruent affect. Skin:  Left ankle with no bruising, rashes, or erythema. Overlying skin intact.   Significant swelling across anterolateral ankle. Antalgic gait.   Imaging/Procedures: Ultrasound guided left ankle Cortisone Injection:  Risks and benefits of procedure discussed, Patient opted to proceed. verbal Consent obtained.  Timeout performed.  Skin prepped in a sterile fashion with betadine before further cleansing with alcohol. Ethyl Chloride was used for topical analgesia.  Left Ankle was injected with 3cc 0.25% Bupivacaine without epinephrine via the anterior approach under ultrasound guidance, using a 25G, 1.5in needle. Syringe was removed from the needle, and 6mg  betamethasone was then injected into the joint.   Patient tolerated the injection well with no immediate complications. Aftercare instructions were discussed, and patient was given strict return precautions.   Assessment & Plan: 65yoF presenting to clinic for left ankle injection under US guidance. Injection performed as described above, which patient  tolerated very well. Aftercare and return precautions discussed.  Patient had no further questions or concerns today.        PMFS History: Patient Active Problem List   Diagnosis Date Noted  . Post-traumatic osteoarthritis, left ankle and foot 07/08/2019  . Lumbar radiculopathy 05/03/2019  . Hypercholesteremia    Past Medical History:  Diagnosis Date  . Diabetes mellitus without complication ([place])   . Gout   . Hypercholesteremia   . Hypertension     No family history on file.  Past Surgical History:  Procedure Laterality Date  . FRACTURE SURGERY    . ORIF ANKLE FRACTURE Left    Social History   Occupational History  . Not on file  Tobacco Use  . Smoking status: Never Smoker  . Smokeless tobacco: Never Used  Substance and Sexual Activity  . Alcohol use: Not Currently  . Drug use: Not on file  . Sexual activity: Not on file

## 2021-01-16 NOTE — Progress Notes (Signed)
I saw and examined the patient with Dr. Elouise Munroe and agree with assessment and plan as outlined.    Ankle was injected with steroid today.  Return as needed.

## 2021-05-15 ENCOUNTER — Other Ambulatory Visit: Payer: Self-pay

## 2021-05-15 ENCOUNTER — Ambulatory Visit: Payer: Managed Care, Other (non HMO) | Admitting: Endocrinology

## 2021-05-15 ENCOUNTER — Encounter: Payer: Self-pay | Admitting: Endocrinology

## 2021-05-15 VITALS — BP 130/76 | HR 72 | Ht 65.5 in | Wt 216.8 lb

## 2021-05-15 DIAGNOSIS — E041 Nontoxic single thyroid nodule: Secondary | ICD-10-CM | POA: Diagnosis not present

## 2021-05-15 DIAGNOSIS — E119 Type 2 diabetes mellitus without complications: Secondary | ICD-10-CM | POA: Diagnosis not present

## 2021-05-15 LAB — POCT GLYCOSYLATED HEMOGLOBIN (HGB A1C): Hemoglobin A1C: 5.8 % — AB (ref 4.0–5.6)

## 2021-05-15 NOTE — Progress Notes (Signed)
Subjective:    Patient ID: Julie Foster, female    DOB: July 07, 1956, 65 y.o.   MRN: [id number]  HPI Pt is referred by Dr Orland Mustard, for hypercalcemia.  Pt was noted to have hypercalcemia in 2019.  she has never had osteoporosis, urolithiasis, thyroid probs, parathyroid probs, sarcoidosis, cancer, PUD, pancreatitis.  only bony fracture was left ankle (2010--traumatic).  she does not take dedicated vitamin-D or A supplement, but she takes multivitamin..  Pt denies taking antacids, Li++, or HCTZ. She does not take Ca++ supplement.   Past Medical History:  Diagnosis Date   Diabetes mellitus without complication ([place])    Gout    Hypercholesteremia    Hypertension     Past Surgical History:  Procedure Laterality Date   FRACTURE SURGERY     ORIF ANKLE FRACTURE Left     Social History   Socioeconomic History   Marital status: Married    Spouse name: Not on file   Number of children: Not on file   Years of education: Not on file   Highest education level: Not on file  Occupational History   Not on file  Tobacco Use   Smoking status: Never   Smokeless tobacco: Never  Substance and Sexual Activity   Alcohol use: Not Currently   Drug use: Not on file   Sexual activity: Not on file  Other Topics Concern   Not on file  Social History Narrative   ** Merged History Encounter **       Social Determinants of Health   Financial Resource Strain: Not on file  Food Insecurity: Not on file  Transportation Needs: Not on file  Physical Activity: Not on file  Stress: Not on file  Social Connections: Not on file  Intimate Partner Violence: Not on file    Current Outpatient Medications on File Prior to Visit  Medication Sig Dispense Refill   allopurinol (ZYLOPRIM) 100 MG tablet Take 100 mg by mouth daily.     aspirin EC 81 MG tablet Take 81 mg by mouth daily with breakfast.     atorvastatin (LIPITOR) 10 MG tablet Take 10 mg by mouth daily.     azelastine (ASTELIN) 0.1 %  nasal spray Place into both nostrils.     colchicine 0.6 MG tablet      ferrous sulfate 325 (65 FE) MG EC tablet Take 325 mg by mouth daily with breakfast.     HYDROcodone-acetaminophen (NORCO/VICODIN) 5-325 MG tablet Take 1 tablet by mouth every 6 (six) hours as needed for moderate pain. 30 tablet 0   ibuprofen (ADVIL) 800 MG tablet Take 800 mg by mouth at bedtime.     indapamide (LOZOL) 2.5 MG tablet Take 2.5 mg by mouth daily with breakfast.     lisinopril (PRINIVIL,ZESTRIL) 20 MG tablet Take 20 mg by mouth daily with breakfast.     meclizine (ANTIVERT) 25 MG tablet Take 1 tablet (25 mg total) by mouth 3 (three) times daily as needed for dizziness. 30 tablet 0   metFORMIN (GLUCOPHAGE) 500 MG tablet Take 500 mg by mouth daily with breakfast.     MULTIPLE VITAMIN PO Take by mouth.     NONFORMULARY OR COMPOUNDED ITEM 3 (three) times daily. Patient takes all 3 every morning for inflammation.     Omega-3 Fatty Acids (FISH OIL PO) Take 2 capsules by mouth daily with breakfast.     ondansetron (ZOFRAN-ODT) 8 MG disintegrating tablet Take 1 tablet (8 mg total) by mouth every 8 (  eight) hours as needed for nausea or vomiting. 10 tablet 0   potassium chloride SA (K-DUR,KLOR-CON) 20 MEQ tablet Take 20 mEq by mouth daily.     No current facility-administered medications on file prior to visit.    No Known Allergies  Family History  Problem Relation Age of Onset   Hypercalcemia Neg Hx     BP 130/76 (BP Location: Right Arm, Patient Position: Sitting, Cuff Size: Large)   Pulse 72   Ht 5' 5.5" (1.664 m)   Wt 216 lb 12.8 oz (98.3 kg)   SpO2 96%   BMI 35.53 kg/m     Review of Systems Denies polyuria, hematuria, depression, numbness, and back pain.  She has weight gain.      Objective:   Physical Exam VS: see vs page GEN: no distress HEAD: head: no deformity eyes: no periorbital swelling, no proptosis external nose and ears are normal NECK: 2-3 cm right thyroid nodule CHEST WALL: no  deformity.  No kyphosis.   LUNGS: clear to auscultation.   CV: reg rate and rhythm, no murmur.  MUSCULOSKELETAL: gait is normal and steady EXTEMITIES: no deformity.  no leg edema NEURO:  readily moves all 4's.  sensation is intact to touch on all 4's SKIN:  Normal texture and temperature.  No rash or suspicious lesion is visible.   NODES:  None palpable at the neck PSYCH: alert, well-oriented.  Does not appear anxious nor depressed.  Lab Results  Component Value Date   CALCIUM 10.7 (H) 06/29/2018   CAION 1.13 01/27/2009   Lab Results  Component Value Date   TSH 0.877 06/07/2009   I have reviewed outside records, and summarized: Pt was noted to have elevated Ca++, and referred here.  She received steroid injection for ankle pain     Assessment & Plan:  Hypercalcemia, new to me, uncertain etiology and prognosis.  Thyroid nodule, new, uncertain etiology and prognosis.

## 2021-05-15 NOTE — Patient Instructions (Addendum)
Blood tests are requested for you today.  We'll let you know about the results.   Let's check the ultrasound.  you will receive a phone call, about a day and time for an appointment.   If there is a nodule, please come back here for a biopsy.  It is easy.

## 2021-05-30 ENCOUNTER — Other Ambulatory Visit: Payer: Managed Care, Other (non HMO)

## 2021-05-31 ENCOUNTER — Telehealth: Payer: Self-pay | Admitting: Family Medicine

## 2021-05-31 MED ORDER — HYDROCODONE-ACETAMINOPHEN 5-325 MG PO TABS: 1.0000 | ORAL_TABLET | Freq: Every evening | ORAL | 0 refills | Status: AC | PRN

## 2021-05-31 NOTE — Telephone Encounter (Signed)
Please advise 

## 2021-05-31 NOTE — Telephone Encounter (Signed)
Patient's spouse Marcello Moores called advised patient need Rx refilled (Hydrocodone) The  number to contact spouse [phone]

## 2021-05-31 NOTE — Telephone Encounter (Signed)
I called and advised the patient. 

## 2021-06-15 ENCOUNTER — Other Ambulatory Visit (INDEPENDENT_AMBULATORY_CARE_PROVIDER_SITE_OTHER): Payer: Managed Care, Other (non HMO)

## 2021-06-15 DIAGNOSIS — E041 Nontoxic single thyroid nodule: Secondary | ICD-10-CM | POA: Diagnosis not present

## 2021-06-15 LAB — TSH: TSH: 1.37 u[IU]/mL (ref 0.35–5.50)

## 2021-06-15 LAB — HEPATIC FUNCTION PANEL
ALT: 21 U/L (ref 0–35)
AST: 19 U/L (ref 0–37)
Albumin: 3.9 g/dL (ref 3.5–5.2)
Alkaline Phosphatase: 40 U/L (ref 39–117)
Bilirubin, Direct: 0.1 mg/dL (ref 0.0–0.3)
Total Bilirubin: 0.4 mg/dL (ref 0.2–1.2)
Total Protein: 7 g/dL (ref 6.0–8.3)

## 2021-06-15 LAB — T4, FREE: Free T4: 0.55 ng/dL — ABNORMAL LOW (ref 0.60–1.60)

## 2021-06-15 LAB — VITAMIN D 25 HYDROXY (VIT D DEFICIENCY, FRACTURES): VITD: 22.68 ng/mL — ABNORMAL LOW (ref 30.00–100.00)

## 2021-06-18 ENCOUNTER — Other Ambulatory Visit: Payer: Self-pay

## 2021-06-18 ENCOUNTER — Other Ambulatory Visit: Payer: Self-pay | Admitting: Endocrinology

## 2021-06-18 ENCOUNTER — Ambulatory Visit
Admission: RE | Admit: 2021-06-18 | Discharge: 2021-06-18 | Disposition: A | Discharge status: Home / Self Care | Payer: Managed Care, Other (non HMO) | Source: Ambulatory Visit | Attending: Endocrinology | Admitting: Endocrinology

## 2021-06-18 DIAGNOSIS — E042 Nontoxic multinodular goiter: Secondary | ICD-10-CM

## 2021-06-18 DIAGNOSIS — E041 Nontoxic single thyroid nodule: Secondary | ICD-10-CM

## 2021-06-19 ENCOUNTER — Other Ambulatory Visit: Payer: Self-pay | Admitting: Endocrinology

## 2021-06-19 DIAGNOSIS — E042 Nontoxic multinodular goiter: Secondary | ICD-10-CM

## 2021-06-20 LAB — VITAMIN A: Vitamin A (Retinoic Acid): 71 ug/dL (ref 38–98)

## 2021-06-20 LAB — PTH, INTACT AND CALCIUM
Calcium: 10.1 mg/dL (ref 8.6–10.4)
PTH: 134 pg/mL — ABNORMAL HIGH (ref 16–77)

## 2021-06-20 LAB — PTH-RELATED PEPTIDE: PTH-Related Protein (PTH-RP): 10 pg/mL — ABNORMAL LOW (ref 11–20)

## 2021-07-11 ENCOUNTER — Encounter: Payer: Self-pay | Admitting: Family

## 2021-07-11 ENCOUNTER — Other Ambulatory Visit: Payer: Self-pay

## 2021-07-11 ENCOUNTER — Ambulatory Visit (INDEPENDENT_AMBULATORY_CARE_PROVIDER_SITE_OTHER): Payer: Managed Care, Other (non HMO) | Admitting: Family

## 2021-07-11 DIAGNOSIS — M19172 Post-traumatic osteoarthritis, left ankle and foot: Secondary | ICD-10-CM | POA: Diagnosis not present

## 2021-07-11 MED ORDER — LIDOCAINE HCL 1 % IJ SOLN
2.0000 mL | INTRAMUSCULAR | Status: AC | PRN
  Administered 2021-07-11: 2 mL

## 2021-07-11 MED ORDER — METHYLPREDNISOLONE ACETATE 40 MG/ML IJ SUSP
40.0000 mg | INTRAMUSCULAR | Status: AC | PRN
  Administered 2021-07-11: 40 mg via INTRA_ARTICULAR

## 2021-07-11 NOTE — Progress Notes (Signed)
Office Visit Note   Patient: Julie Foster           Date of Birth: 1956/08/04           MRN: [id number] Visit Date: 07/11/2021              Requested by: London Pepper, MD [place],  [place] [address] PCP: London Pepper, MD  No chief complaint on file.     HPI: Patient is a 65 year old woman well-known to Dr. Junius Roads.  She comes in today for routine evaluation of her left ankle.  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  It appears that her last injection was in March of this year  Assessment & Plan: Visit Diagnoses: No diagnosis found.  Plan: Depo-Medrol injection of the left ankle today.  Patient tolerated well.  She will follow-up in the office as needed  Did discuss that if she prefers for the next injection we could refer her to Dr. Ernestina Patches or [facility] imaging for ultrasound guidance  Follow-Up Instructions: Return if symptoms worsen or fail to improve.   Left Ankle Exam   Tenderness  Left ankle tenderness location: anterior joint line.  Swelling: moderate  Range of Motion  The patient has normal left ankle ROM.   Muscle Strength  The patient has normal left ankle strength.  Other  Erythema: absent     Patient is alert, oriented, no adenopathy, well-dressed, normal affect, normal respiratory effort.   Imaging: No results found. No images are attached to the encounter.  Labs: Lab Results  Component Value Date   HGBA1C 5.8 (A) 05/15/2021   HGBA1C 6.3 (H) 12/10/2010   ESRSEDRATE 38 (H) 06/07/2009     Lab Results  Component Value Date   ALBUMIN 3.9 06/15/2021   ALBUMIN 4.5 12/10/2010   ALBUMIN 4.6 09/06/2009    No results found for: MG Lab Results  Component Value Date   VD25OH 22.68 (L) 06/15/2021    No results found for: PREALBUMIN CBC EXTENDED Latest Ref Rng & Units 06/29/2018 12/10/2010  06/07/2009  WBC 4.0 - 10.5 K/uL 8.7 6.9 6.7  RBC 3.87 - 5.11 MIL/uL 3.54(L) 4.35 3.85(L)  HGB 12.0 - 15.0 g/dL 10.5(L) 12.9 11.2(L)  HCT 36.0 - 46.0 % 31.6(L) 37.9 34.2(L)  PLT 150 - 400 K/uL 360 303 302  NEUTROABS 1.7 - 7.7 K/uL - 2.7 3.6  LYMPHSABS 0.7 - 4.0 K/uL - 3.6 2.4     There is no height or weight on file to calculate BMI.  Orders:  Orders Placed This Encounter  Procedures   Medium Joint Inj   No orders of the defined types were placed in this encounter.    Procedures: Medium Joint Inj: L ankle on 07/11/2021 1:23 PM Indications: pain Details: 25 G needle, anterolateral approach Medications: 2 mL lidocaine 1 %; 40 mg methylPREDNISolone acetate 40 MG/ML Outcome: tolerated well, no immediate complications Consent was given by the patient. Patient was prepped and draped in the usual sterile fashion.     Clinical Data: No additional findings.  ROS:  All other systems negative, except as noted in the HPI. Review of Systems  Objective: Vital Signs: There were no vitals taken for this visit.  Specialty Comments:  No specialty comments available.  PMFS History: Patient Active Problem List   Diagnosis Date Noted   Multinodular goiter 06/18/2021  Hypercalcemia 05/15/2021   Post-traumatic osteoarthritis, left ankle and foot 07/08/2019   Lumbar radiculopathy 05/03/2019   Hypercholesteremia    Past Medical History:  Diagnosis Date   Diabetes mellitus without complication ([place])    Gout    Hypercholesteremia    Hypertension     Family History  Problem Relation Age of Onset   Hypercalcemia Neg Hx     Past Surgical History:  Procedure Laterality Date   FRACTURE SURGERY     ORIF ANKLE FRACTURE Left    Social History   Occupational History   Not on file  Tobacco Use   Smoking status: Never   Smokeless tobacco: Never  Substance and Sexual Activity   Alcohol use: Not Currently   Drug use: Not on file   Sexual activity: Not on file

## 2021-07-19 ENCOUNTER — Ambulatory Visit
Admission: RE | Admit: 2021-07-19 | Discharge: 2021-07-19 | Disposition: A | Discharge status: Home / Self Care | Payer: Managed Care, Other (non HMO) | Source: Ambulatory Visit | Attending: Endocrinology | Admitting: Endocrinology

## 2021-07-19 ENCOUNTER — Other Ambulatory Visit (HOSPITAL_COMMUNITY)
Admission: RE | Admit: 2021-07-19 | Discharge: 2021-07-19 | Disposition: A | Discharge status: Home / Self Care | Payer: Managed Care, Other (non HMO) | Source: Ambulatory Visit | Attending: Endocrinology | Admitting: Endocrinology

## 2021-07-19 DIAGNOSIS — E042 Nontoxic multinodular goiter: Secondary | ICD-10-CM

## 2021-07-19 DIAGNOSIS — D440 Neoplasm of uncertain behavior of thyroid gland: Secondary | ICD-10-CM | POA: Diagnosis not present

## 2021-07-23 LAB — CYTOLOGY - NON PAP

## 2021-07-30 ENCOUNTER — Telehealth: Payer: Self-pay | Admitting: Endocrinology

## 2021-07-30 NOTE — Telephone Encounter (Signed)
Patient called to find out the results from her [place] [facility] Biospy - done 07/19/21.  Patient requesting a call to [phone]

## 2021-08-01 NOTE — Telephone Encounter (Signed)
Message sent thru [facility] regarding Biopsy results that has been sent off and results take about 2 weeks to come back.

## 2021-08-06 ENCOUNTER — Encounter: Payer: Self-pay | Admitting: Endocrinology

## 2021-08-10 ENCOUNTER — Other Ambulatory Visit: Payer: Self-pay | Admitting: Family Medicine

## 2021-08-10 ENCOUNTER — Other Ambulatory Visit: Payer: Self-pay | Admitting: *Deleted

## 2021-08-10 DIAGNOSIS — Z1231 Encounter for screening mammogram for malignant neoplasm of breast: Secondary | ICD-10-CM

## 2021-08-22 ENCOUNTER — Encounter (HOSPITAL_COMMUNITY): Payer: Self-pay

## 2021-09-10 ENCOUNTER — Ambulatory Visit
Admission: RE | Admit: 2021-09-10 | Discharge: 2021-09-10 | Disposition: A | Discharge status: Home / Self Care | Payer: Managed Care, Other (non HMO) | Source: Ambulatory Visit | Attending: Family Medicine | Admitting: Family Medicine

## 2021-09-10 ENCOUNTER — Other Ambulatory Visit: Payer: Self-pay

## 2021-09-10 DIAGNOSIS — Z1231 Encounter for screening mammogram for malignant neoplasm of breast: Secondary | ICD-10-CM

## 2021-09-27 ENCOUNTER — Other Ambulatory Visit: Payer: Self-pay

## 2021-09-27 ENCOUNTER — Ambulatory Visit (INDEPENDENT_AMBULATORY_CARE_PROVIDER_SITE_OTHER): Payer: Managed Care, Other (non HMO) | Admitting: Orthopedic Surgery

## 2021-09-27 ENCOUNTER — Ambulatory Visit (INDEPENDENT_AMBULATORY_CARE_PROVIDER_SITE_OTHER): Payer: Managed Care, Other (non HMO)

## 2021-09-27 DIAGNOSIS — M25531 Pain in right wrist: Secondary | ICD-10-CM | POA: Diagnosis not present

## 2021-09-27 MED ORDER — DICLOFENAC SODIUM 75 MG PO TBEC: 75.0000 mg | DELAYED_RELEASE_TABLET | Freq: Two times a day (BID) | ORAL | 0 refills | Status: DC

## 2021-09-27 MED ORDER — DICLOFENAC SODIUM 75 MG PO TBEC: 75.0000 mg | DELAYED_RELEASE_TABLET | Freq: Two times a day (BID) | ORAL | 0 refills | Status: AC

## 2021-09-27 NOTE — Progress Notes (Signed)
Office Visit Note   Patient: Julie Foster           Date of Birth: 12-Apr-1956           MRN: [id number] Visit Date: 09/27/2021              Requested by: London Pepper, MD [place] [address],  [place] [address] PCP: London Pepper, MD   Assessment & Plan: Visit Diagnoses:  1. Pain in right wrist     Plan: Discussed with patient that her x-rays are relatively unremarkable with subtle cystic changes in the lunate and mild radioscaphoid degenerative changes.  She has minimal pain w/ ROM of the wrist or provocative test.  We will plan to treat this with a period of immobilization and anti-inflammatory use.  I can see her back in a month if she's still having difficulty.   Follow-Up Instructions: No follow-ups on file.   Orders:  Orders Placed This Encounter  Procedures   XR Wrist 2 Views Right   Meds ordered this encounter  Medications   diclofenac (VOLTAREN) 75 MG EC tablet    Sig: Take 1 tablet (75 mg total) by mouth 2 (two) times daily.    Dispense:  60 tablet    Refill:  0      Procedures: No procedures performed   Clinical Data: No additional findings.   Subjective: Chief Complaint  Patient presents with   Right Wrist - Pain    This is a 65 yo RHD F who presents with mild right wrist pain.  This has been going on for several months.  Her pain is vague and poorly localized but most across the dorsum of the wrist.  It is worse w/ prolonged activity.  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  She denies any injury to this wrist.    Review of Systems   Objective: Vital Signs: There were no vitals taken for this visit.  Physical Exam Constitutional:      Appearance: Normal appearance.  Cardiovascular:     Rate and Rhythm: Normal rate.     Pulses: Normal pulses.  Pulmonary:     Effort: Pulmonary effort is normal.  Skin:    General: Skin is warm and dry.     Capillary  Refill: Capillary refill takes less than 2 seconds.  Neurological:     Mental Status: She is alert.    Right Hand Exam   Tenderness  Right hand tenderness location: Minimal tenderness across dorsum of wrist.  Range of Motion  The patient has normal right wrist ROM.   Muscle Strength  The patient has normal right wrist strength.  Other  Erythema: absent Sensation: normal Pulse: present  Comments:  No obvious wrist swelling compared to contralateral side.  Full and largely painless ROM with flexion/extension, and radial/ulnar deviation.  No pain w/ compression of DRUJ.      Specialty Comments:  No specialty comments available.  Imaging: 2V of the right wrist taken today are reviewed and interpreted by me.  They demonstrate some subtle bony cyst at the lunate but without sclerosis, collapse, or radiolunate or midcarpal arthritis.  There are mild degenerative changes at the radioscaphoid articulation.    PMFS History: Patient Active Problem List   Diagnosis Date Noted   Multinodular goiter 06/18/2021   Hypercalcemia 05/15/2021   Post-traumatic osteoarthritis, left ankle and foot 07/08/2019  Lumbar radiculopathy 05/03/2019   Hypercholesteremia    Past Medical History:  Diagnosis Date   Diabetes mellitus without complication ([place])    Gout    Hypercholesteremia    Hypertension     Family History  Problem Relation Age of Onset   Hypercalcemia Neg Hx    Breast cancer Neg Hx     Past Surgical History:  Procedure Laterality Date   FRACTURE SURGERY     ORIF ANKLE FRACTURE Left    Social History   Occupational History   Not on file  Tobacco Use   Smoking status: Never   Smokeless tobacco: Never  Substance and Sexual Activity   Alcohol use: Not Currently   Drug use: Not on file   Sexual activity: Not on file

## 2021-10-24 ENCOUNTER — Ambulatory Visit (INDEPENDENT_AMBULATORY_CARE_PROVIDER_SITE_OTHER): Payer: Managed Care, Other (non HMO) | Admitting: Orthopaedic Surgery

## 2021-10-24 ENCOUNTER — Ambulatory Visit (INDEPENDENT_AMBULATORY_CARE_PROVIDER_SITE_OTHER): Payer: Managed Care, Other (non HMO)

## 2021-10-24 ENCOUNTER — Encounter: Payer: Self-pay | Admitting: Orthopaedic Surgery

## 2021-10-24 ENCOUNTER — Other Ambulatory Visit: Payer: Self-pay

## 2021-10-24 DIAGNOSIS — M25572 Pain in left ankle and joints of left foot: Secondary | ICD-10-CM

## 2021-10-24 NOTE — Progress Notes (Signed)
° °  Office Visit Note   Patient: Julie Foster           Date of Birth: 1956-06-06           MRN: [id number] Visit Date: 10/24/2021              Requested by: London Pepper, MD [place] [address],  [place] [address] PCP: London Pepper, MD   Assessment & Plan: Visit Diagnoses:  1. Pain in left ankle and joints of left foot     Plan: Impression is left ankle posttraumatic osteoarthritis.  Today we discussed treatment options to include repeat cortisone injection.  She would like to have this done under ultrasound or x-ray so we will make referral to Dr. Ernestina Patches for this.  She will follow-up with [place] as needed.  Call with concerns or questions.  Follow-Up Instructions: Return if symptoms worsen or fail to improve.   Orders:  Orders Placed This Encounter  Procedures   XR Ankle Complete Left   No orders of the defined types were placed in this encounter.     Procedures: No procedures performed   Clinical Data: No additional findings.   Subjective: Chief Complaint  Patient presents with   Left Ankle - Follow-up    HPI patient is a pleasant 65 year old female who comes in today with chronic left ankle pain.  She suffers from posttraumatic arthritis following a fracture which she sustained back in 2013.  The pain she has is to the entire ankle worse with walking and activity.  She does wear a brace while at work which provide support.  She has been getting periodic cortisone injections to the left ankle which have provided relief in the past with the exception of her last injection which was in September of this past year.  She notes that her injections only work when performed under ultrasound with the needle pointed in a certain direction.  Review of Systems as detailed in HPI.  All others reviewed and negative.   Objective: Vital Signs: There were no vitals taken for this visit.  Physical Exam well-developed well-nourished female no acute  distress.  Alert and oriented x3.  Ortho Exam unchanged left ankle exam  Specialty Comments:  No specialty comments available.  Imaging: No new imaging   PMFS History: Patient Active Problem List   Diagnosis Date Noted   Multinodular goiter 06/18/2021   Hypercalcemia 05/15/2021   Post-traumatic osteoarthritis, left ankle and foot 07/08/2019   Lumbar radiculopathy 05/03/2019   Hypercholesteremia    Past Medical History:  Diagnosis Date   Diabetes mellitus without complication ([place])    Gout    Hypercholesteremia    Hypertension     Family History  Problem Relation Age of Onset   Hypercalcemia Neg Hx    Breast cancer Neg Hx     Past Surgical History:  Procedure Laterality Date   FRACTURE SURGERY     ORIF ANKLE FRACTURE Left    Social History   Occupational History   Not on file  Tobacco Use   Smoking status: Never   Smokeless tobacco: Never  Substance and Sexual Activity   Alcohol use: Not Currently   Drug use: Not on file   Sexual activity: Not on file

## 2021-11-08 ENCOUNTER — Ambulatory Visit (INDEPENDENT_AMBULATORY_CARE_PROVIDER_SITE_OTHER): Payer: Managed Care, Other (non HMO) | Admitting: Physical Medicine and Rehabilitation

## 2021-11-08 ENCOUNTER — Other Ambulatory Visit: Payer: Self-pay

## 2021-11-08 ENCOUNTER — Encounter: Payer: Self-pay | Admitting: Physical Medicine and Rehabilitation

## 2021-11-08 ENCOUNTER — Ambulatory Visit: Payer: Self-pay

## 2021-11-08 DIAGNOSIS — M19172 Post-traumatic osteoarthritis, left ankle and foot: Secondary | ICD-10-CM

## 2021-11-08 NOTE — Progress Notes (Signed)
Pt state left ankle pain. Pt state walking makes the pain worse. Pt state she takes over the counter pain meds to help ease her pain.  Numeric Pain Rating Scale and Functional Assessment Average Pain 10   In the last MONTH (on 0-10 scale) has pain interfered with the following?  1. General activity like being  able to carry out your everyday physical activities such as walking, climbing stairs, carrying groceries, or moving a chair?  Rating(10)   +Driver, -BT, -Dye Allergies.

## 2021-12-01 MED ORDER — BUPIVACAINE HCL 0.25 % IJ SOLN
3.0000 mL | INTRAMUSCULAR | Status: AC | PRN
  Administered 2021-11-08: 3 mL via INTRA_ARTICULAR

## 2021-12-01 MED ORDER — TRIAMCINOLONE ACETONIDE 40 MG/ML IJ SUSP
40.0000 mg | INTRAMUSCULAR | Status: AC | PRN
  Administered 2021-11-08: 40 mg via INTRA_ARTICULAR

## 2021-12-01 NOTE — Progress Notes (Signed)
° °  Lubertha Sayres Leighana Neyman - 66 y.o. female MRN [id number]  Date of birth: Mar 28, 1956  Office Visit Note: Visit Date: 11/08/2021 PCP: London Pepper, MD Referred by: London Pepper, MD  Subjective: Chief Complaint  Patient presents with   Left Ankle - Pain   HPI:  Analena Gama is a 66 y.o. female who comes in today at the request of Dr. Eduard Roux for planned Left anesthetic ankle arthrogram with fluoroscopic guidance.  The patient has failed conservative care including home exercise, medications, time and activity modification.  This injection will be diagnostic and hopefully therapeutic.  Please see requesting physician notes for further details and justification.   ROS Otherwise per HPI.  Assessment & Plan: Visit Diagnoses:    ICD-10-CM   1. Post-traumatic osteoarthritis, left ankle and foot  M19.172 XR C-ARM NO REPORT      Plan: No additional findings.   Meds & Orders: No orders of the defined types were placed in this encounter.   Orders Placed This Encounter  Procedures   Large Joint Inj   XR C-ARM NO REPORT    Follow-up: Return for visit to requesting provider as needed.   Procedures: Left Ankle Joint on 11/08/2021 8:30 AM Indications: pain and diagnostic evaluation Details: 22 G 3.5 in needle, fluoroscopy-guided anterior approach  Arthrogram: No  Medications: 40 mg triamcinolone acetonide 40 MG/ML; 3 mL bupivacaine 0.25 % Outcome: tolerated well, no immediate complications  Excellent flow of contrast providing partial arthrogram of the left ankle.  Patient did have some relief during the anesthetic phase. Consent was given by the patient. Immediately prior to procedure a time out was called to verify the correct patient, procedure, equipment, support staff and site/side marked as required. Patient was prepped and draped in the usual sterile fashion.         Clinical History: No specialty comments available.     Objective:  VS:  HT:     WT:     BMI:      BP:    HR: bpm   TEMP: ( )   RESP:  Physical Exam   Imaging: No results found.

## 2021-12-03 ENCOUNTER — Ambulatory Visit: Payer: Managed Care, Other (non HMO) | Admitting: Cardiology

## 2021-12-03 ENCOUNTER — Encounter: Payer: Self-pay | Admitting: Cardiology

## 2021-12-03 ENCOUNTER — Other Ambulatory Visit: Payer: Self-pay

## 2021-12-03 VITALS — BP 150/84 | HR 70 | Temp 97.2°F | Resp 16 | Ht 65.0 in | Wt 230.0 lb

## 2021-12-03 DIAGNOSIS — E119 Type 2 diabetes mellitus without complications: Secondary | ICD-10-CM

## 2021-12-03 DIAGNOSIS — R072 Precordial pain: Secondary | ICD-10-CM

## 2021-12-03 DIAGNOSIS — E7800 Pure hypercholesterolemia, unspecified: Secondary | ICD-10-CM

## 2021-12-03 DIAGNOSIS — Z87891 Personal history of nicotine dependence: Secondary | ICD-10-CM

## 2021-12-03 DIAGNOSIS — E1169 Type 2 diabetes mellitus with other specified complication: Secondary | ICD-10-CM

## 2021-12-03 NOTE — Progress Notes (Signed)
Date:  12/03/2021   ID:  Julie Foster, DOB [birth year], MRN [id number]  PCP:  London Pepper, MD  Cardiologist:  Rex Kras, DO, [facility] (established care 12/03/2021)  REASON FOR CONSULT: Chest pain  REQUESTING PHYSICIAN:  London Pepper, MD [address] [place],  [place] [address]  Chief Complaint  Patient presents with   Chest Pain   New Patient (Initial Visit)    HPI  Julie Foster is a 66 y.o. African-American  female who presents to the office with a chief complaint of " chest pain." Patient's past medical history and cardiovascular risk factors include: Hyperlipidemia, hypertension, gout, non-insulin-dependent diabetes type 2, former smoker, post menopausal.   She is referred to the office at the request of London Pepper, MD for evaluation of chest pain.  This patient is accompanied in the office by her spouse Julie Foster. Julie Foster provides verbal consent with regards to having him present during today's encounter.  Patient presents for evaluation of precordial discomfort, it started couple months ago, occurs twice a week, she is seeking medical attention now as the intensity is more noticeable, and close family and friends are suffering from heart attacks.  The discomfort is usually brought on when she gets angry or is watching TV.  It is nonexertional, does not resolve with resting, self-limited.  Symptoms happen intermittently, lasting for a few minutes, located over the left anterior chest wall, no improving or worsening factors.  During the discomfort she takes approximately 2 baby aspirin's and the symptoms usually resolve.  Her diabetes is well controlled based on the external labs reviewed.  Her blood pressure at today's office visit is not well controlled.  And she does not check her blood pressures at home.  Patient states that she was recently at a providers office and during that visit the blood pressure was  within normal limits.  No family history of premature coronary disease or sudden cardiac death.  FUNCTIONAL STATUS: No structured exercise program or daily routine.   ALLERGIES: No Known Allergies  MEDICATION LIST PRIOR TO VISIT: Current Meds  Medication Sig   allopurinol (ZYLOPRIM) 100 MG tablet Take 100 mg by mouth daily.   aspirin EC 81 MG tablet Take 81 mg by mouth daily with breakfast.   atorvastatin (LIPITOR) 10 MG tablet Take 10 mg by mouth daily.   azelastine (ASTELIN) 0.1 % nasal spray Place into both nostrils.   colchicine 0.6 MG tablet    ferrous sulfate 325 (65 FE) MG EC tablet Take 325 mg by mouth daily with breakfast.   HYDROcodone-acetaminophen (NORCO/VICODIN) 5-325 MG tablet Take 1 tablet by mouth at bedtime as needed for moderate pain.   indapamide (LOZOL) 2.5 MG tablet Take 2.5 mg by mouth daily with breakfast.   lisinopril (PRINIVIL,ZESTRIL) 20 MG tablet Take 20 mg by mouth daily with breakfast.   meclizine (ANTIVERT) 25 MG tablet Take 1 tablet (25 mg total) by mouth 3 (three) times daily as needed for dizziness.   metFORMIN (GLUCOPHAGE) 500 MG tablet Take 500 mg by mouth daily with breakfast.   MULTIPLE VITAMIN PO Take by mouth.   NONFORMULARY OR COMPOUNDED ITEM 3 (three) times daily. Patient takes all 3 every morning for inflammation.   Omega-3 Fatty Acids (FISH OIL PO) Take 2 capsules by mouth daily with breakfast.   ondansetron (ZOFRAN-ODT) 8 MG disintegrating tablet Take 1 tablet (8 mg total) by mouth every 8 (eight) hours as needed for nausea or vomiting.  potassium chloride SA (K-DUR,KLOR-CON) 20 MEQ tablet Take 20 mEq by mouth daily.     PAST MEDICAL HISTORY: Past Medical History:  Diagnosis Date   Diabetes mellitus without complication ([place])    Gout    Hypercholesteremia    Hypertension     PAST SURGICAL HISTORY: Past Surgical History:  Procedure Laterality Date   FRACTURE SURGERY     ORIF ANKLE FRACTURE Left     FAMILY HISTORY: The patient  family history includes Cancer in her mother.  SOCIAL HISTORY:  The patient  reports that she has quit smoking. Her smoking use included cigarettes. She smoked an average of .25 packs per day. She has never used smokeless tobacco. She reports that she does not currently use alcohol. She reports that she does not use drugs.  REVIEW OF SYSTEMS: Review of Systems  Cardiovascular:  Positive for chest pain. Negative for dyspnea on exertion, leg swelling, orthopnea, palpitations, paroxysmal nocturnal dyspnea and syncope.  Respiratory:  Negative for shortness of breath.    PHYSICAL EXAM: Vitals with BMI 12/03/2021 12/03/2021 05/15/2021  Height - 5\' 5"  5' 5.5"  Weight - 230 lbs 216 lbs 13 oz  BMI - 72.09 47.09  Systolic 628 366 294  Diastolic 84 765 76  Pulse 70 71 72    CONSTITUTIONAL: Well-developed and well-nourished. No acute distress.  SKIN: Skin is warm and dry. No rash noted. No cyanosis. No pallor. No jaundice HEAD: Normocephalic and atraumatic.  EYES: No scleral icterus MOUTH/THROAT: Moist oral membranes.  NECK: No JVD present. No thyromegaly noted. No carotid bruits  LYMPHATIC: No visible cervical adenopathy.  CHEST Normal respiratory effort. No intercostal retractions  LUNGS: Clear to auscultation bilaterally.  No stridor. No wheezes. No rales.  CARDIOVASCULAR: Regular rate and rhythm, positive S1-S2, no murmurs rubs or gallops appreciated. ABDOMINAL: Obese, soft, nontender, nondistended, positive bowel sounds all 4 quadrants. No apparent ascites.  EXTREMITIES: No peripheral edema, warm to touch, +2 bilateral DP and weak bilateral PT pulses HEMATOLOGIC: No significant bruising NEUROLOGIC: Oriented to person, place, and time. Nonfocal. Normal muscle tone.  PSYCHIATRIC: Normal mood and affect. Normal behavior. Cooperative  CARDIAC DATABASE: EKG: 12/03/2021: NSR 66 bpm, without underlying injury pattern.  Echocardiogram: No results found for this or any previous visit from the past  1095 days.    Stress Testing: No results found for this or any previous visit from the past 1095 days.   Heart Catheterization: None  LABORATORY DATA: External Labs: Collected: 11/21/2021. Hemoglobin A1c 6.1. BUN 21, creatinine 1.08. Sodium 141, potassium 4.2, chloride 111, bicarb 22, AST 54, ALT 66, alkaline phosphatase 60  Collected: 09/14/2021 Hemoglobin 13 g/dL, hematocrit 38.7% TSH 0.97.  Collected 05/21/2021: Total cholesterol 205, triglycerides 72, HDL 78, LDL 114, non-HDL 127  IMPRESSION:    ICD-10-CM   1. Precordial pain  R07.2 EKG 12-Lead    PCV ECHOCARDIOGRAM COMPLETE    PCV CARDIAC STRESS TEST    CT CARDIAC SCORING ([facility] LOCATIONS ONLY)    2. Hypercholesteremia  E78.00     3. Type 2 diabetes mellitus without complication, without long-term current use of insulin (HCC)  E11.9     4. Type 2 diabetes mellitus with hyperlipidemia (HCC)  E11.69    E78.5     5. Former smoker  Z87.891     70. Class 2 severe obesity due to excess calories with serious comorbidity and body mass index (BMI) of 38.0 to 38.9 in adult [facility])  E66.01    Y65.03  RECOMMENDATIONS: Julie Foster is a 66 y.o. African-American female whose past medical history and cardiac risk factors include: Hyperlipidemia, hypertension, gout, non-insulin-dependent diabetes type 2, former smoker, post menopausal.   Precordial pain Based on symptoms appears to be noncardiac. EKG normal sinus rhythm. Echo will be ordered to evaluate for structural heart disease and left ventricular systolic function. Plan exercise treadmill stress test to evaluate functional status and exercise-induced ischemia as the EKG is interpretable and patient is able to exercise. Coronary calcium score for further risk stratification.  Educated on seeking medical attention sooner by going to the closest ER via EMS if the symptoms increase in intensity, frequency, duration, or has typical chest pain as discussed  in the office.  Patient verbalized understanding.   Benign essential hypertension: Office blood pressures are not well controlled. Patient states that she was recently seen by another provider last week and her blood pressures were fine. I have asked her to check her blood pressures at home and if still elevated SBP >130 mmHg she should reach out to her PCP or myself for further medication titration.  Both patient and husband verbalized understanding. Also encouraged her the importance of medication compliance.  Medication list states that she is on lisinopril but the medication bottles that she brings in does not have this present.  And she is unaware of what she is taking regularly.   Hypercholesteremia Currently on atorvastatin.   She denies myalgia or other side effects. Most recent lipids dated July 2022 provided by PCP.  LDL not at goal.  Recommended goal LDL of at least less than 70 mg/dL Currently managed by primary care provider.  Type 2 diabetes mellitus without complication, without long-term current use of insulin (HCC) Hemoglobin A1c well controlled. Medication list notes that she is on lisinopril -however, patient is not sure.  If not on lisinopril, recommend either an ACE or ARB given her diabetes. May also consider SGLT2 inhibitors if and when clinically relevant -we will defer to primary team  Former smoker Educated on the importance of continued smoking cessation.  Class 2 severe obesity due to excess calories with serious comorbidity and body mass index (BMI) of 38.0 to 38.9 in adult [facility]) Body mass index is 38.27 kg/m. I reviewed with the patient the importance of diet, regular physical activity/exercise, weight loss.   Patient is educated on increasing physical activity gradually as tolerated.  With the goal of moderate intensity exercise for 30 minutes a day 5 days a week.  As part of today's encounter reviewed referring provider's office note and external labs  independently via [facility].  Discussed disease management, coordination of care, ordering diagnostic tests such as echocardiogram/stress test/coronary calcium score.    FINAL MEDICATION LIST END OF ENCOUNTER: No orders of the defined types were placed in this encounter.   There are no discontinued medications.   Current Outpatient Medications:    allopurinol (ZYLOPRIM) 100 MG tablet, Take 100 mg by mouth daily., Disp: , Rfl:    aspirin EC 81 MG tablet, Take 81 mg by mouth daily with breakfast., Disp: , Rfl:    atorvastatin (LIPITOR) 10 MG tablet, Take 10 mg by mouth daily., Disp: , Rfl:    azelastine (ASTELIN) 0.1 % nasal spray, Place into both nostrils., Disp: , Rfl:    colchicine 0.6 MG tablet, , Disp: , Rfl:    ferrous sulfate 325 (65 FE) MG EC tablet, Take 325 mg by mouth daily with breakfast., Disp: , Rfl:  HYDROcodone-acetaminophen (NORCO/VICODIN) 5-325 MG tablet, Take 1 tablet by mouth at bedtime as needed for moderate pain., Disp: 10 tablet, Rfl: 0   indapamide (LOZOL) 2.5 MG tablet, Take 2.5 mg by mouth daily with breakfast., Disp: , Rfl:    lisinopril (PRINIVIL,ZESTRIL) 20 MG tablet, Take 20 mg by mouth daily with breakfast., Disp: , Rfl:    meclizine (ANTIVERT) 25 MG tablet, Take 1 tablet (25 mg total) by mouth 3 (three) times daily as needed for dizziness., Disp: 30 tablet, Rfl: 0   metFORMIN (GLUCOPHAGE) 500 MG tablet, Take 500 mg by mouth daily with breakfast., Disp: , Rfl:    MULTIPLE VITAMIN PO, Take by mouth., Disp: , Rfl:    NONFORMULARY OR COMPOUNDED ITEM, 3 (three) times daily. Patient takes all 3 every morning for inflammation., Disp: , Rfl:    Omega-3 Fatty Acids (FISH OIL PO), Take 2 capsules by mouth daily with breakfast., Disp: , Rfl:    ondansetron (ZOFRAN-ODT) 8 MG disintegrating tablet, Take 1 tablet (8 mg total) by mouth every 8 (eight) hours as needed for nausea or vomiting., Disp: 10 tablet, Rfl: 0   potassium chloride SA (K-DUR,KLOR-CON) 20 MEQ  tablet, Take 20 mEq by mouth daily., Disp: , Rfl:   Orders Placed This Encounter  Procedures   CT CARDIAC SCORING ([facility] LOCATIONS ONLY)   PCV CARDIAC STRESS TEST   EKG 12-Lead   PCV ECHOCARDIOGRAM COMPLETE    There are no Patient Instructions on file for this visit.   --Continue cardiac medications as reconciled in final medication list. --Return in about 4 weeks (around 12/31/2021) for Reevaluation of, Chest pain, Review test results. Or sooner if needed. --Continue follow-up with your primary care physician regarding the management of your other chronic comorbid conditions.  Patient's questions and concerns were addressed to her satisfaction. She voices understanding of the instructions provided during this encounter.   This note was created using a voice recognition software as a result there may be grammatical errors inadvertently enclosed that do not reflect the nature of this encounter. Every attempt is made to correct such errors.  Rex Kras, [place], [facility]  Pager: [phone] Office: [phone]

## 2021-12-20 ENCOUNTER — Other Ambulatory Visit: Payer: Self-pay

## 2021-12-20 ENCOUNTER — Ambulatory Visit: Payer: Managed Care, Other (non HMO)

## 2021-12-20 DIAGNOSIS — R072 Precordial pain: Secondary | ICD-10-CM

## 2021-12-25 ENCOUNTER — Ambulatory Visit: Payer: Managed Care, Other (non HMO)

## 2021-12-25 ENCOUNTER — Other Ambulatory Visit: Payer: Self-pay

## 2021-12-25 DIAGNOSIS — R072 Precordial pain: Secondary | ICD-10-CM

## 2021-12-31 ENCOUNTER — Ambulatory Visit: Payer: Managed Care, Other (non HMO) | Admitting: Cardiology

## 2022-01-01 ENCOUNTER — Other Ambulatory Visit: Payer: Self-pay | Admitting: Family Medicine

## 2022-01-01 DIAGNOSIS — R7989 Other specified abnormal findings of blood chemistry: Secondary | ICD-10-CM

## 2022-01-04 ENCOUNTER — Other Ambulatory Visit: Payer: Managed Care, Other (non HMO)

## 2022-03-23 IMAGING — US US THYROID
1 series · 12 of 25 positions shown · non-contrast
Comparison: None.

CLINICAL DATA: Nodule on physical exam

EXAM:
THYROID ULTRASOUND
TECHNIQUE: Ultrasound examination of the thyroid gland and adjacent soft
tissues was performed.

[Series 1: us thyroid · 0.08mm/px · 12 of 43 slices shown]
[im 2/43]
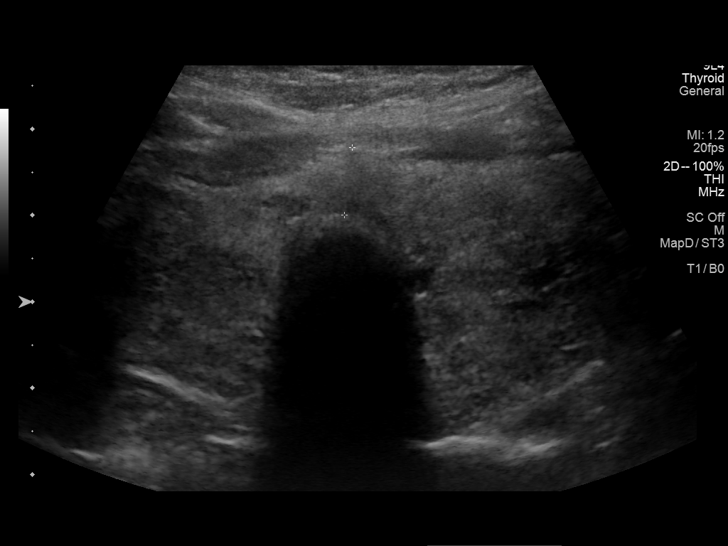
[im 6/43]
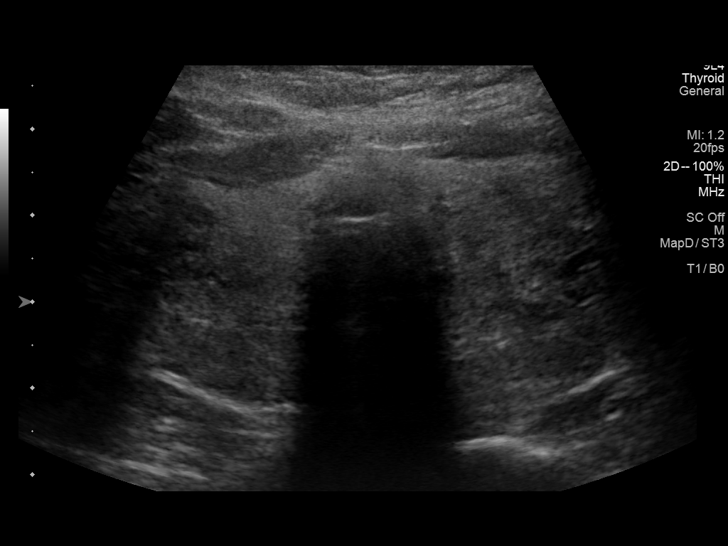
[im 9/43]
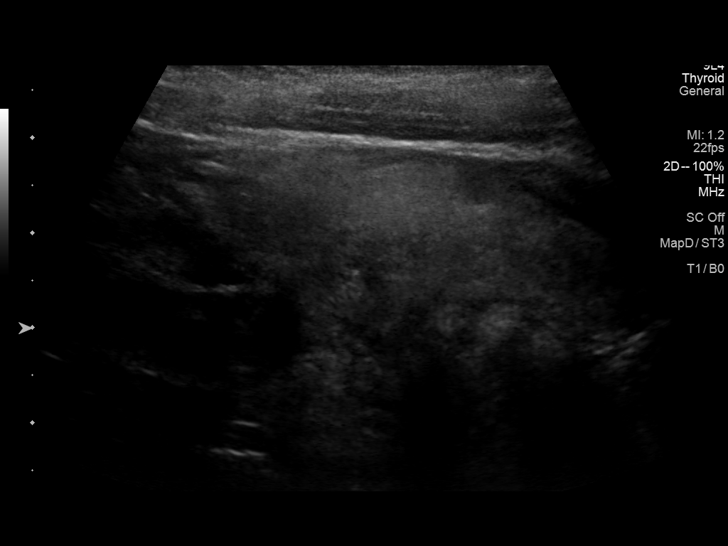
[im 13/43]
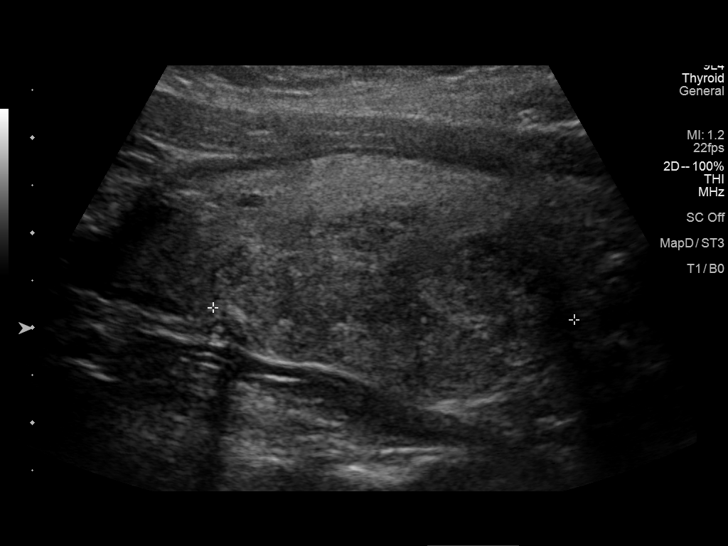
[im 16/43]
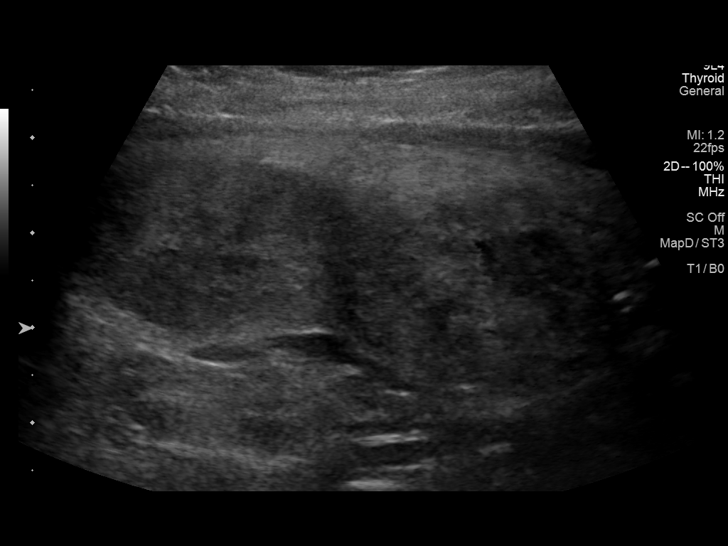
[im 20/43]
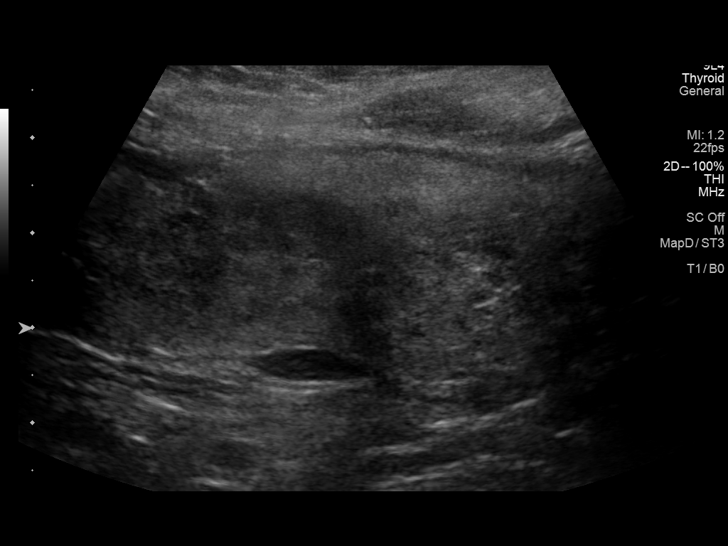
[im 23/43]
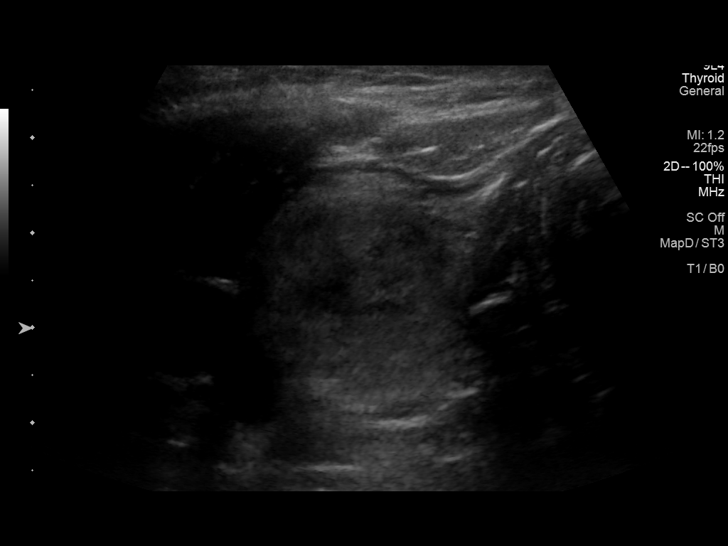
[im 27/43]
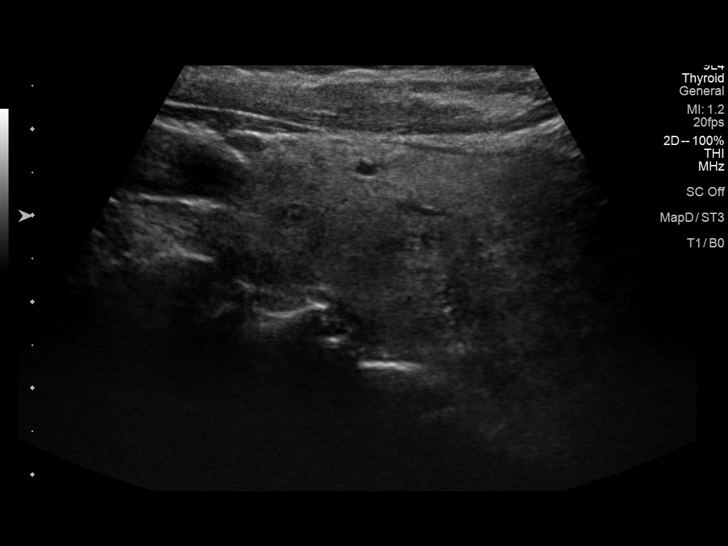
[im 30/43]
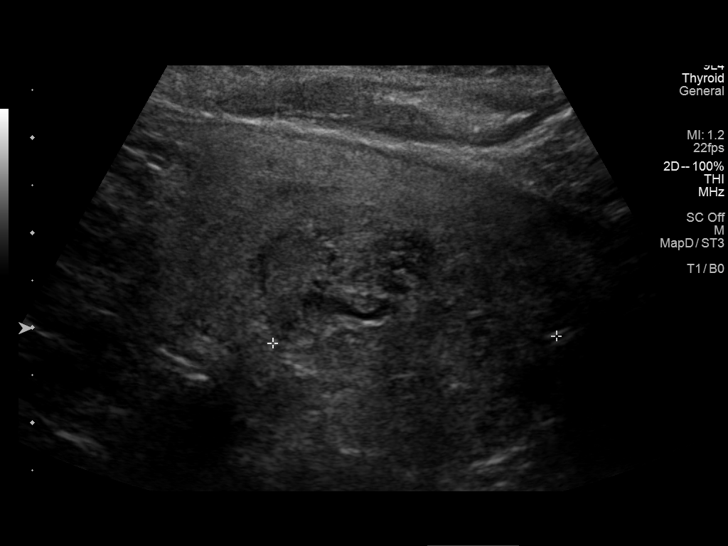
[im 34/43]
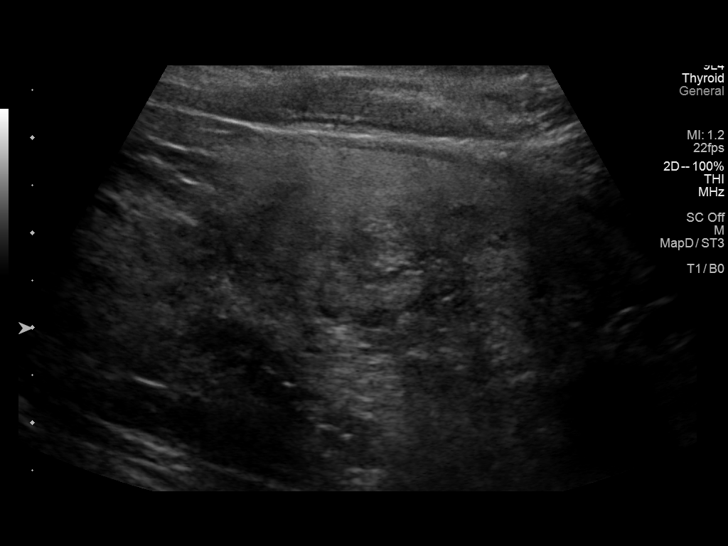
[im 37/43]
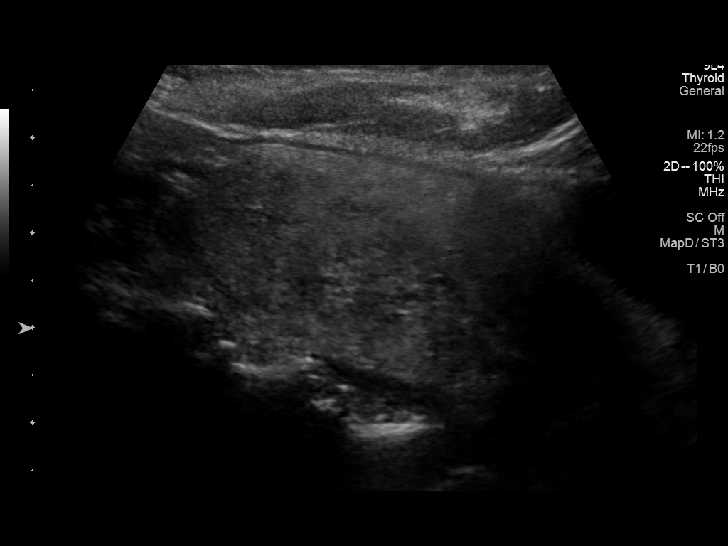
[im 41/43]
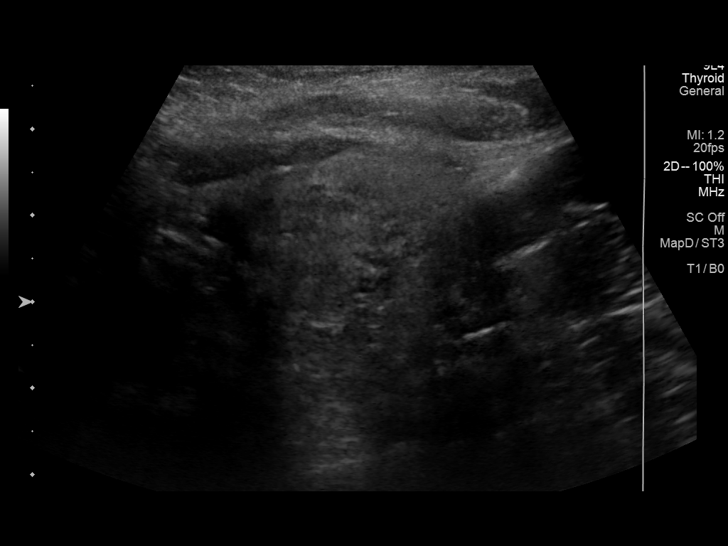

[12 of 25 positions shown; findings below may reference images not displayed]

FINDINGS: Parenchymal Echotexture: Moderately heterogenous

Isthmus: 0.8 cm thickness

Right lobe: 7 x 3 x 3.1 cm

Left lobe: 5.8 x 3.3 x 2.5 cm

_________________________________________________________

Estimated total number of nodules >/= 1 cm: 4

Number of spongiform nodules >/=  2 cm not described below (TR1): 0

Number of mixed cystic and solid nodules >/= 1.5 cm not described
below (TR2): 0

_________________________________________________________

Nodule # 1:

Location: Right; superior

Maximum size: 3.1 cm; Other 2 dimensions: 2.3 x 1.7 cm

Composition: mixed cystic and solid (1)

Echogenicity: hypoechoic (2)

Shape: not taller-than-wide (0)

Margins: smooth (0)

Echogenic foci: none (0)

ACR TI-RADS total points: 3.

ACR TI-RADS risk category: TR3 (3 points).

ACR TI-RADS recommendations:

**Given size (>/= 2.5 cm) and appearance, fine needle aspiration of
this mildly suspicious nodule should be considered based on TI-RADS
criteria.

_________________________________________________________

Nodule # 2:

Location: Right; inferior

Maximum size: 3.8 cm; Other 2 dimensions: 2.8 x 2 cm

Composition: solid/almost completely solid (2)

Echogenicity: hypoechoic (2)

Shape: not taller-than-wide (0)

Margins: smooth (0)

Echogenic foci: none (0)

ACR TI-RADS total points: 4.

ACR TI-RADS risk category: TR4 (4-6 points).

ACR TI-RADS recommendations:

**Given size (>/= 1.5 cm) and appearance, fine needle aspiration of
this moderately suspicious nodule should be considered based on
TI-RADS criteria.

_________________________________________________________

Nodule # 3:

Location: Left; superior

Maximum size: 1.2 cm; Other 2 dimensions: 0.9 x 0.9 cm

Composition: solid/almost completely solid (2)

Echogenicity: hypoechoic (2)

Shape: not taller-than-wide (0)

Margins: smooth (0)

Echogenic foci: none (0)

ACR TI-RADS total points: 4.

ACR TI-RADS risk category: TR4 (4-6 points).

ACR TI-RADS recommendations:

*Given size (>/= 1 - 1.4 cm) and appearance, a follow-up ultrasound
in 1 year should be considered based on TI-RADS criteria.

_________________________________________________________

Nodule # 4:

Location: Left; inferior

Maximum size: 3 cm; Other 2 dimensions: 3 x 2.5 cm

Composition: solid/almost completely solid (2)

Echogenicity: isoechoic (1)

Shape: not taller-than-wide (0)

Margins: ill-defined (0)

Echogenic foci: none (0)

ACR TI-RADS total points: 3.

ACR TI-RADS risk category: TR3 (3 points).

Possible pseudonodule.

ACR TI-RADS recommendations:

**Given size (>/= 2.5 cm) and appearance, fine needle aspiration of
this mildly suspicious nodule should be considered based on TI-RADS
criteria.

_________________________________________________________

No regional cervical adenopathy identified.
IMPRESSION: 1. Thyromegaly with bilateral nodules.
Recommend FNA biopsy of moderately suspicious 3.8 cm inferior right
and mildly suspicious 3.1 cm superior right nodules.
2. Recommend annual/biennial ultrasound follow-up of additional
nodules as above, until stability x5 years confirmed.

The above is in keeping with the ACR TI-RADS recommendations - [HOSPITAL] 8637;[DATE].

## 2022-05-28 ENCOUNTER — Other Ambulatory Visit: Payer: Self-pay | Admitting: Family Medicine

## 2022-05-28 DIAGNOSIS — R7989 Other specified abnormal findings of blood chemistry: Secondary | ICD-10-CM

## 2022-06-15 IMAGING — MG MM DIGITAL SCREENING BILAT W/ TOMO AND CAD
8 series · 8 of 24 positions shown · non-contrast
Comparison: Previous exam(s).

CLINICAL DATA: Screening.

EXAM:
DIGITAL SCREENING BILATERAL MAMMOGRAM WITH TOMOSYNTHESIS AND CAD
TECHNIQUE: Bilateral screening digital craniocaudal and mediolateral oblique
mammograms were obtained. Bilateral screening digital breast
tomosynthesis was performed. The images were evaluated with
computer-aided detection.

[R CC synth-2D]
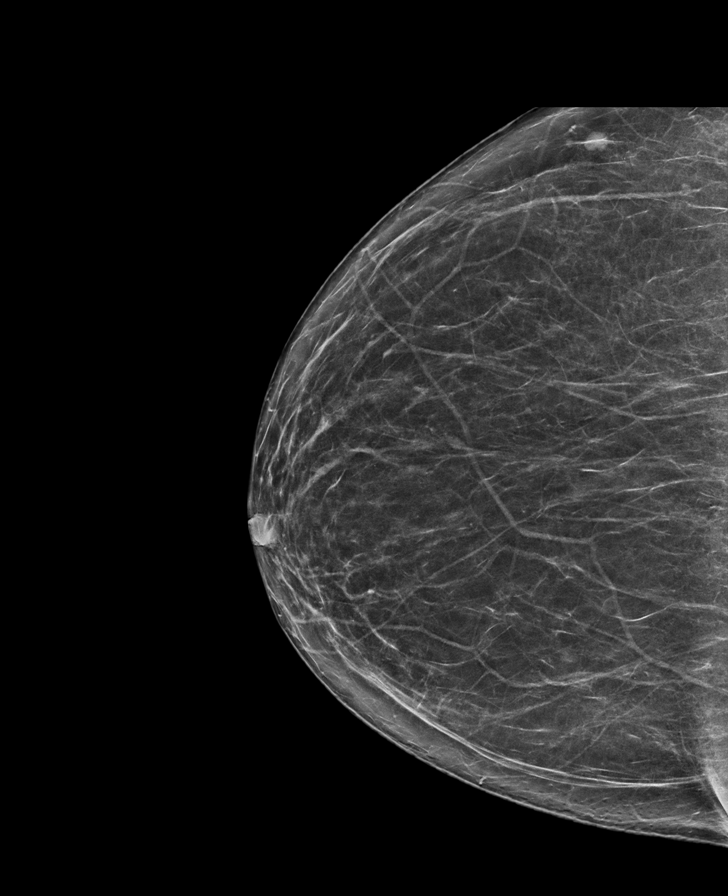

[R MLO synth-2D]
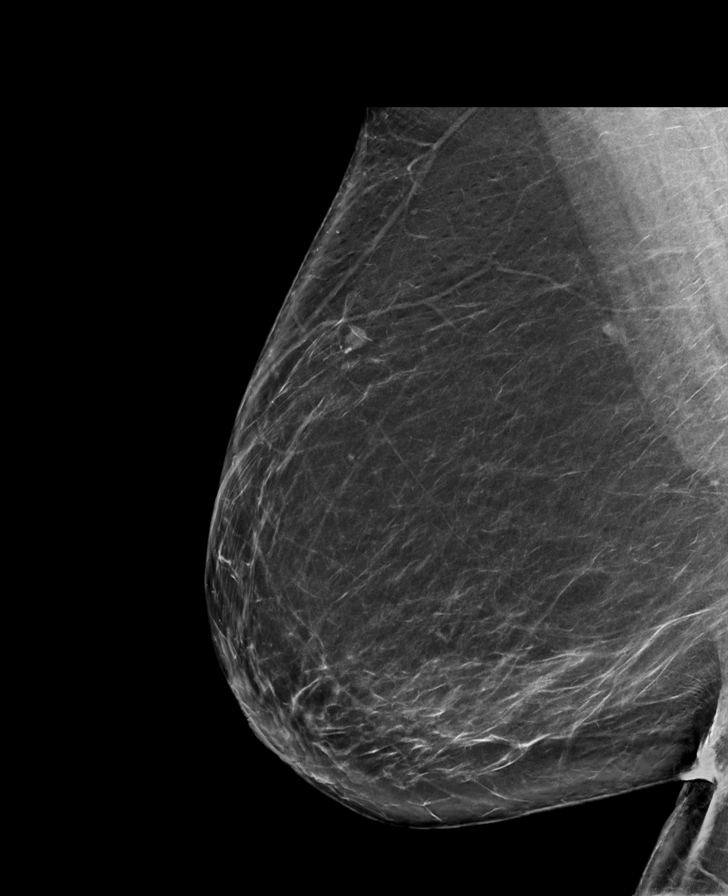

[L MLO synth-2D]
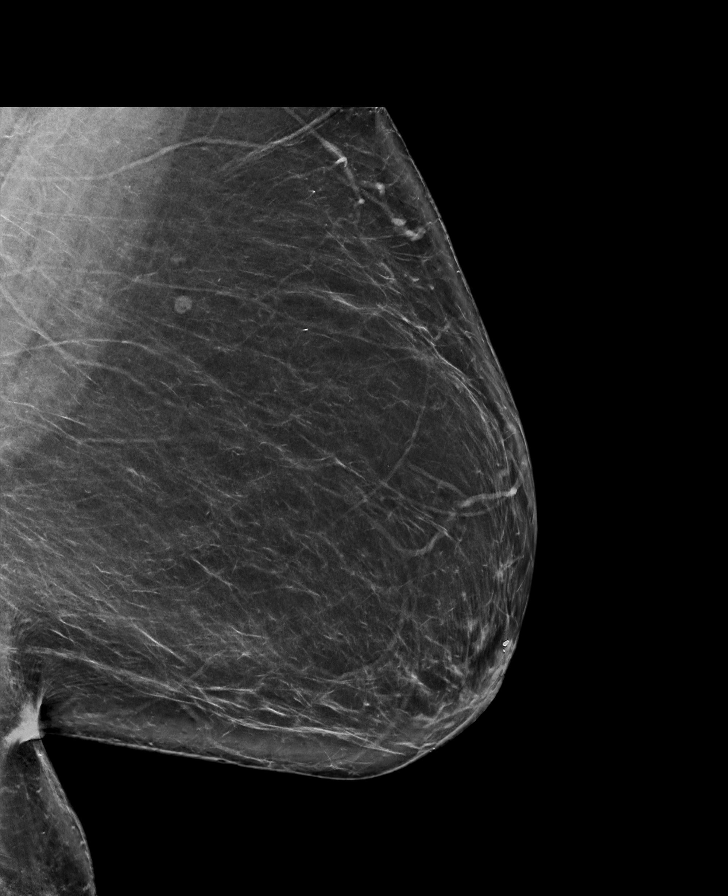

[L CC synth-2D]
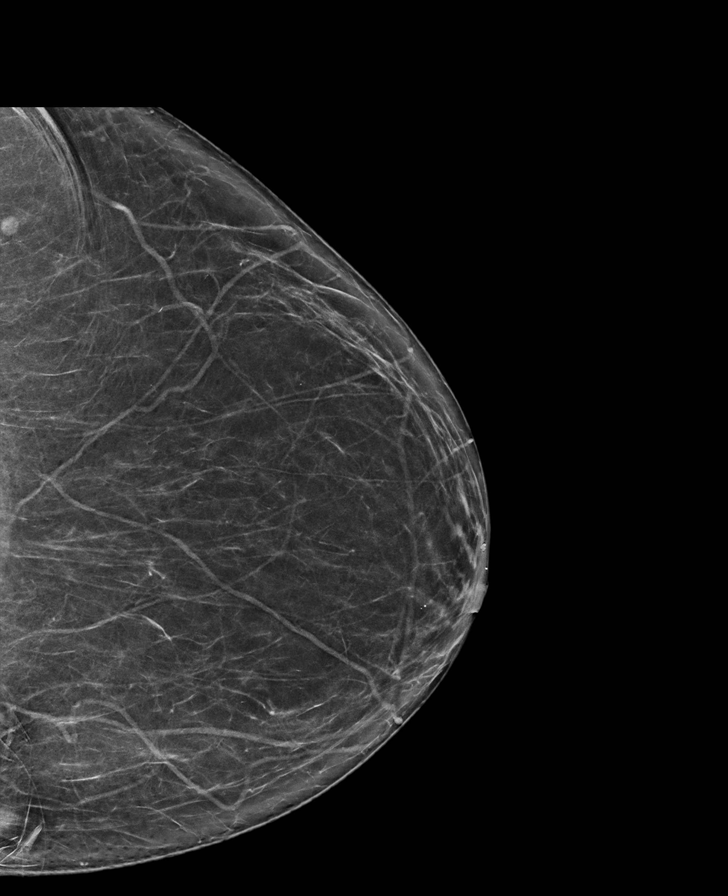

[R MLO tomo · tomo slice 46/91.0]
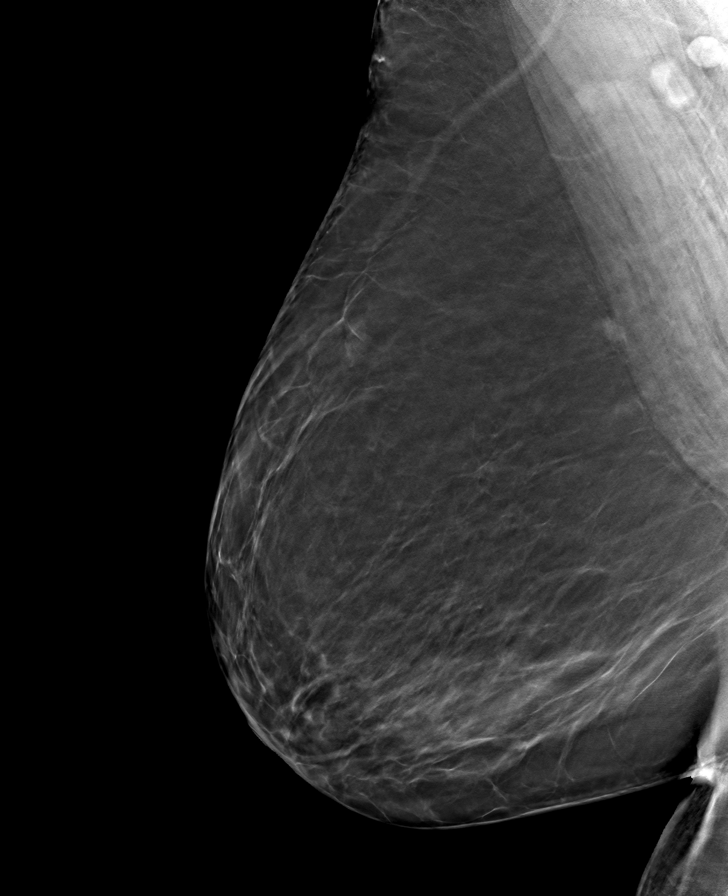

[L MLO tomo · tomo slice 45/88.0]
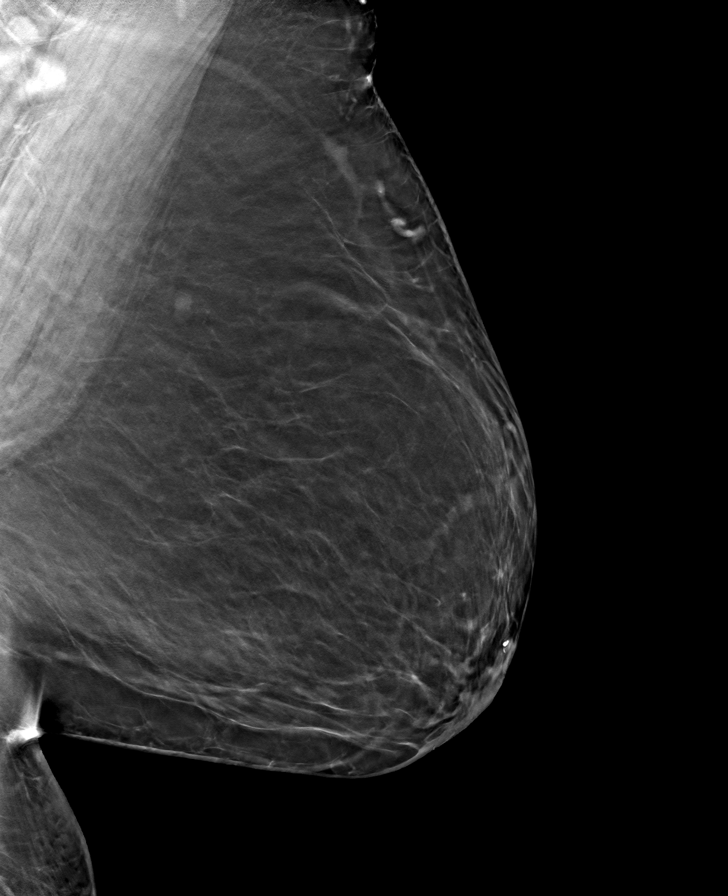

[L CC tomo · tomo slice 38/75.0]
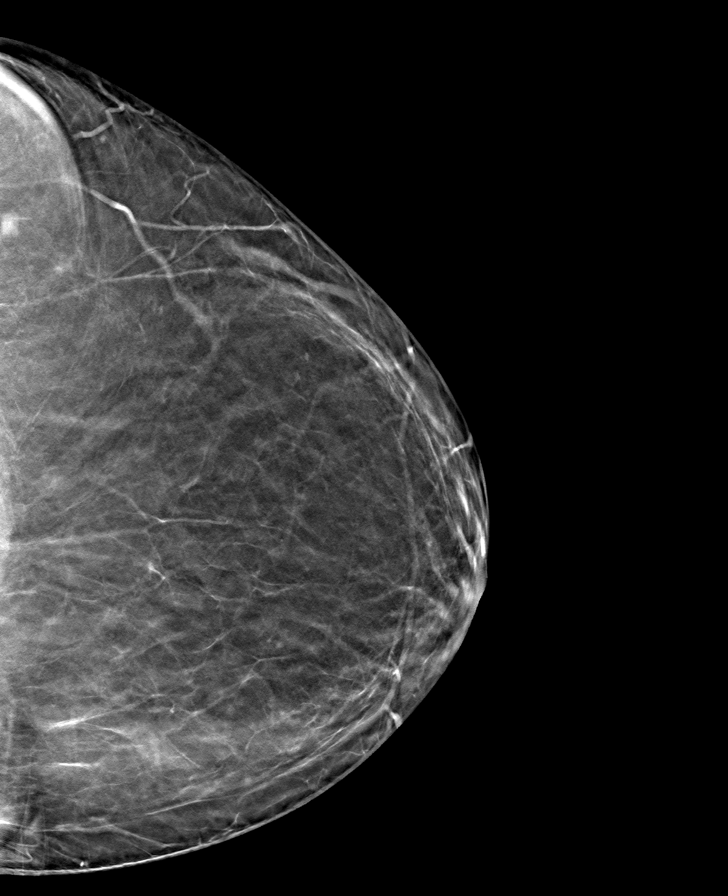

[R CC tomo · tomo slice 38/75.0]
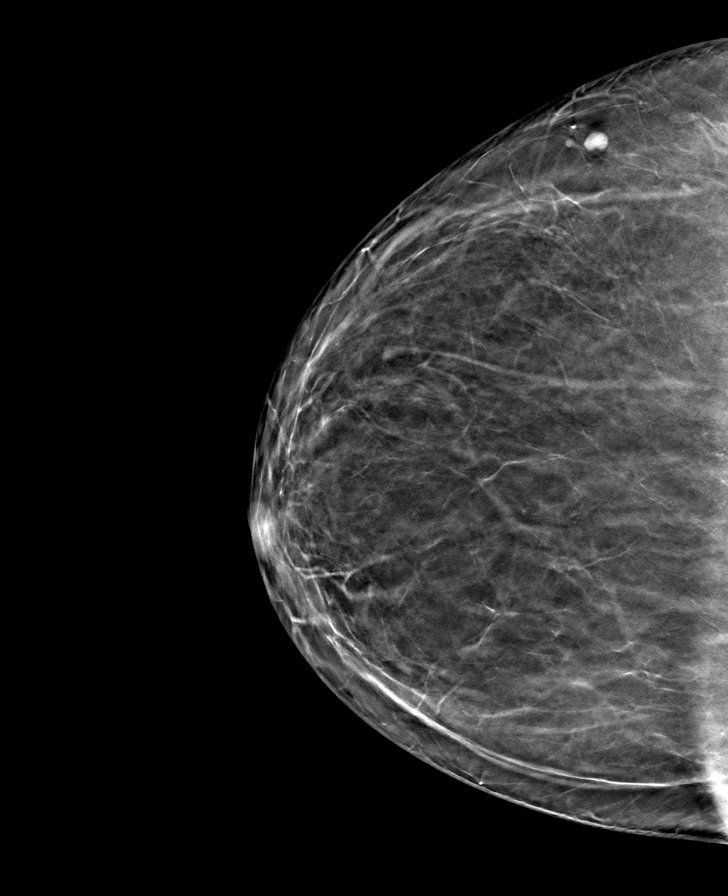

[8 of 24 positions shown; findings below may reference images not displayed]

ACR Breast Density Category b: There are scattered areas of
fibroglandular density.
FINDINGS: There are no findings suspicious for malignancy.
IMPRESSION: No mammographic evidence of malignancy. A result letter of this
screening mammogram will be mailed directly to the patient.

RECOMMENDATION:
Screening mammogram in one year. (Code:51-O-LD2)

BI-RADS CATEGORY  1: Negative.

## 2022-07-03 ENCOUNTER — Ambulatory Visit
Admission: RE | Admit: 2022-07-03 | Discharge: 2022-07-03 | Disposition: A | Discharge status: Home / Self Care | Payer: Commercial Managed Care - HMO | Source: Ambulatory Visit | Attending: Family Medicine | Admitting: Family Medicine

## 2022-07-03 DIAGNOSIS — R7989 Other specified abnormal findings of blood chemistry: Secondary | ICD-10-CM

## 2022-10-18 ENCOUNTER — Other Ambulatory Visit (HOSPITAL_COMMUNITY): Payer: Self-pay | Admitting: Internal Medicine

## 2022-11-06 ENCOUNTER — Encounter (HOSPITAL_COMMUNITY)
Admission: RE | Admit: 2022-11-06 | Discharge: 2022-11-06 | Disposition: A | Discharge status: Home / Self Care | Payer: Commercial Managed Care - HMO | Source: Ambulatory Visit | Attending: Internal Medicine | Admitting: Internal Medicine

## 2022-11-06 MED ORDER — TECHNETIUM TC 99M SESTAMIBI GENERIC - CARDIOLITE
24.9000 | Freq: Once | INTRAVENOUS | Status: AC | PRN
  Administered 2022-11-06: 24.9 via INTRAVENOUS

## 2023-02-04 ENCOUNTER — Other Ambulatory Visit (HOSPITAL_COMMUNITY): Payer: Self-pay

## 2023-02-05 ENCOUNTER — Other Ambulatory Visit (HOSPITAL_COMMUNITY): Payer: Self-pay

## 2023-02-05 MED ORDER — TRULICITY 4.5 MG/0.5ML ~~LOC~~ SOAJ
4.5000 mg | SUBCUTANEOUS | 0 refills | Status: AC
  Filled 2023-02-05: qty 6, 84d supply, fill #0

## 2023-08-26 ENCOUNTER — Emergency Department (HOSPITAL_COMMUNITY)
Admission: EM | Admit: 2023-08-26 | Discharge: 2023-08-26 | Disposition: A | Discharge status: Home / Self Care | Payer: Commercial Managed Care - HMO | Attending: Emergency Medicine | Admitting: Emergency Medicine

## 2023-08-26 ENCOUNTER — Other Ambulatory Visit: Payer: Self-pay

## 2023-08-26 ENCOUNTER — Encounter (HOSPITAL_COMMUNITY): Payer: Self-pay

## 2023-08-26 DIAGNOSIS — E119 Type 2 diabetes mellitus without complications: Secondary | ICD-10-CM | POA: Insufficient documentation

## 2023-08-26 DIAGNOSIS — Z79899 Other long term (current) drug therapy: Secondary | ICD-10-CM | POA: Insufficient documentation

## 2023-08-26 DIAGNOSIS — Z7984 Long term (current) use of oral hypoglycemic drugs: Secondary | ICD-10-CM | POA: Diagnosis not present

## 2023-08-26 DIAGNOSIS — I10 Essential (primary) hypertension: Secondary | ICD-10-CM | POA: Insufficient documentation

## 2023-08-26 DIAGNOSIS — Z7982 Long term (current) use of aspirin: Secondary | ICD-10-CM | POA: Diagnosis not present

## 2023-08-26 DIAGNOSIS — M79645 Pain in left finger(s): Secondary | ICD-10-CM | POA: Insufficient documentation

## 2023-08-26 MED ORDER — PREDNISONE 10 MG (21) PO TBPK: ORAL_TABLET | Freq: Every day | ORAL | 0 refills | Status: AC

## 2023-08-26 NOTE — ED Provider Notes (Signed)
[place] EMERGENCY DEPARTMENT AT [facility] Provider Note   CSN: [id number] Arrival date & time: 08/26/23  1521     History  Chief Complaint  Patient presents with   Hand Pain    Julie Foster is a 67 y.o. female.  With history of gout, hypertension, diabetes, hypercholesterolemia presenting to the ED for evaluation of left index finger pain.  She states she developed this pain approximately 2 weeks ago.  She was seen by her PCP and started on a steroid taper and the symptoms resolved shortly afterwards.  They then returned yesterday morning.  She attempted to call her PCP but was unable to get an appointment.  She reports inability to flex the digit due to the swelling or pain.  No fevers or chills.  Feels like previous flares of gout.  No recent injuries.   Hand Pain       Home Medications Prior to Admission medications   Medication Sig Start Date End Date Taking? Authorizing Provider  predniSONE (STERAPRED UNI-PAK 21 TAB) 10 MG (21) TBPK tablet Take by mouth daily. Take 6 tabs by mouth daily  for 2 days, then 5 tabs for 2 days, then 4 tabs for 2 days, then 3 tabs for 2 days, 2 tabs for 2 days, then 1 tab by mouth daily for 2 days 08/26/23  Yes Reta Norgren, Edsel Petrin, PA-C  allopurinol (ZYLOPRIM) 100 MG tablet Take 100 mg by mouth daily. 06/23/18   [provider]  aspirin EC 81 MG tablet Take 81 mg by mouth daily with breakfast.    [provider]  atorvastatin (LIPITOR) 10 MG tablet Take 10 mg by mouth daily. 06/02/18   [provider]  azelastine (ASTELIN) 0.1 % nasal spray Place into both nostrils. 10/16/20   [provider]  colchicine 0.6 MG tablet  02/24/19   [provider]  Dulaglutide (TRULICITY) 4.5 MG/0.5ML SOPN Inject 4.5 mg into the skin once a week. 02/05/23     ferrous sulfate 325 (65 FE) MG EC tablet Take 325 mg by mouth daily with breakfast.    [provider]  HYDROcodone-acetaminophen  (NORCO/VICODIN) 5-325 MG tablet Take 1 tablet by mouth at bedtime as needed for moderate pain. 05/31/21   Hilts, Casimiro Needle, MD  indapamide (LOZOL) 2.5 MG tablet Take 2.5 mg by mouth daily with breakfast.    [provider]  lisinopril (PRINIVIL,ZESTRIL) 20 MG tablet Take 20 mg by mouth daily with breakfast.    [provider]  meclizine (ANTIVERT) 25 MG tablet Take 1 tablet (25 mg total) by mouth 3 (three) times daily as needed for dizziness. 06/29/14   Azalia Bilis, MD  metFORMIN (GLUCOPHAGE) 500 MG tablet Take 500 mg by mouth daily with breakfast.    [provider]  MULTIPLE VITAMIN PO Take by mouth.    [provider]  NONFORMULARY OR COMPOUNDED ITEM 3 (three) times daily. Patient takes all 3 every morning for inflammation.    [provider]  Omega-3 Fatty Acids (FISH OIL PO) Take 2 capsules by mouth daily with breakfast.    [provider]  ondansetron (ZOFRAN-ODT) 8 MG disintegrating tablet Take 1 tablet (8 mg total) by mouth every 8 (eight) hours as needed for nausea or vomiting. 06/29/14   Azalia Bilis, MD  potassium chloride SA (K-DUR,KLOR-CON) 20 MEQ tablet Take 20 mEq by mouth daily. 06/02/18   [provider]      Allergies    Patient has no known  allergies.    Review of Systems   Review of Systems  Musculoskeletal:  Positive for arthralgias and joint swelling.  All other systems reviewed and are negative.   Physical Exam Updated Vital Signs BP (!) 185/97 (BP Location: Left Arm)   Pulse 72   Temp 98.4 F (36.9 C) (Oral)   Resp 18   Ht 5' 5.5" (1.664 m)   Wt 94.8 kg   SpO2 96%   BMI 34.25 kg/m  Physical Exam Vitals and nursing note reviewed.  Constitutional:      General: She is not in acute distress.    Appearance: Normal appearance. She is normal weight. She is not ill-appearing.  HENT:     Head: Normocephalic and atraumatic.  Pulmonary:     Effort: Pulmonary effort is normal. No respiratory distress.   Abdominal:     General: Abdomen is flat.  Musculoskeletal:        General: Normal range of motion.     Cervical back: Neck supple.     Comments: Swelling of the PIP of the left index finger.  No overlying erythema.  No swelling to the DIP or MCP.  Finger is held in extension.  Sensation intact distally.  Capillary refill normal.  No TTP to flexor tendon sheath.  Skin:    General: Skin is warm and dry.  Neurological:     Mental Status: She is alert and oriented to person, place, and time.  Psychiatric:        Mood and Affect: Mood normal.        Behavior: Behavior normal.     ED Results / Procedures / Treatments   Labs (all labs ordered are listed, but only abnormal results are displayed) Labs Reviewed - No data to display  EKG None  Radiology No results found.  Procedures Procedures    Medications Ordered in ED Medications - No data to display  ED Course/ Medical Decision Making/ A&P                                 Medical Decision Making  This patient presents to the ED for concern of left index finger pain, this involves an extensive number of treatment options, and is a complaint that carries with it a high risk of complications and morbidity.  The differential diagnosis includes gout, RA, OA, flexor tenosynovitis  Additional history obtained from: Nursing notes from this visit.  Afebrile, hypertensive but otherwise hemodynamically stable.  67 year old female presenting for evaluation of left index finger pain and swelling that began yesterday.  Had a similar episode approximately 2 weeks ago and was treated successfully with prednisone.  He will extensive history of gout.  Reports taking her allopurinol and colchicine without improvement.  Physical exam appears consistent with gout.  Has swelling of the PIP joint.  Kanavel signs are negative.  Low suspicion for flexor tenosynovitis.  Will treat with a Sterapred taper pack.  She was encouraged to return to the  emergency department any new or worsening symptoms.  Stable at discharge.  At this time there does not appear to be any evidence of an acute emergency medical condition and the patient appears stable for discharge with appropriate outpatient follow up. Diagnosis was discussed with patient who verbalizes understanding of care plan and is agreeable to discharge. I have discussed return precautions with patient who verbalizes understanding. Patient encouraged to follow-up with their PCP within 1 week.  All questions answered.  Note: Portions of this report may have been transcribed using voice recognition software. Every effort was made to ensure accuracy; however, inadvertent computerized transcription errors may still be present.        Final Clinical Impression(s) / ED Diagnoses Final diagnoses:  Finger pain, left    Rx / DC Orders ED Discharge Orders          Ordered    predniSONE (STERAPRED UNI-PAK 21 TAB) 10 MG (21) TBPK tablet  Daily        08/26/23 1622              Michelle Piper, PA-C 08/26/23 1623    Rolan Bucco, MD 08/27/23 [phone]

## 2023-08-26 NOTE — ED Triage Notes (Signed)
Pt complaining of left pointer finger pain/swelling. States this finger has had the same issue before, and was treated with prednisone with relief. Unable to schedule with PCP, and current pain is unbearable.

## 2023-08-26 NOTE — Discharge Instructions (Addendum)
You have been seen today for your complaint of left index finger pain and swelling. Your discharge medications include prednisone.  This is a steroid.  Take it as prescribed and for the entire duration of the prescription. Follow up with: Your PCP in 1 week for reevaluation Please seek immediate medical care if you develop any of the following symptoms: You have very bad pain. Your pain cannot be controlled. You cannot pee. At this time there does not appear to be the presence of an emergent medical condition, however there is always the potential for conditions to change. Please read and follow the below instructions.  Do not take your medicine if  develop an itchy rash, swelling in your mouth or lips, or difficulty breathing; call 911 and seek immediate emergency medical attention if this occurs.  You may review your lab tests and imaging results in their entirety on your MyChart account.  Please discuss all results of fully with your primary care provider and other specialist at your follow-up visit.  Note: Portions of this text may have been transcribed using voice recognition software. Every effort was made to ensure accuracy; however, inadvertent computerized transcription errors may still be present.

## 2023-11-10 DIAGNOSIS — M79645 Pain in left finger(s): Secondary | ICD-10-CM | POA: Diagnosis not present

## 2023-12-19 DIAGNOSIS — E119 Type 2 diabetes mellitus without complications: Secondary | ICD-10-CM | POA: Diagnosis not present

## 2023-12-24 DIAGNOSIS — I10 Essential (primary) hypertension: Secondary | ICD-10-CM | POA: Diagnosis not present

## 2023-12-24 DIAGNOSIS — R590 Localized enlarged lymph nodes: Secondary | ICD-10-CM | POA: Diagnosis not present

## 2023-12-24 DIAGNOSIS — J029 Acute pharyngitis, unspecified: Secondary | ICD-10-CM | POA: Diagnosis not present

## 2023-12-24 DIAGNOSIS — Z03818 Encounter for observation for suspected exposure to other biological agents ruled out: Secondary | ICD-10-CM | POA: Diagnosis not present

## 2024-01-05 DIAGNOSIS — M79642 Pain in left hand: Secondary | ICD-10-CM | POA: Diagnosis not present

## 2024-02-18 DIAGNOSIS — E119 Type 2 diabetes mellitus without complications: Secondary | ICD-10-CM | POA: Diagnosis not present

## 2024-02-18 DIAGNOSIS — H25811 Combined forms of age-related cataract, right eye: Secondary | ICD-10-CM | POA: Diagnosis not present

## 2024-03-01 DIAGNOSIS — R519 Headache, unspecified: Secondary | ICD-10-CM | POA: Diagnosis not present

## 2024-03-01 DIAGNOSIS — I10 Essential (primary) hypertension: Secondary | ICD-10-CM | POA: Diagnosis not present

## 2024-03-01 DIAGNOSIS — H269 Unspecified cataract: Secondary | ICD-10-CM | POA: Diagnosis not present

## 2024-03-05 DIAGNOSIS — H25811 Combined forms of age-related cataract, right eye: Secondary | ICD-10-CM | POA: Diagnosis not present

## 2024-04-07 DIAGNOSIS — H2512 Age-related nuclear cataract, left eye: Secondary | ICD-10-CM | POA: Diagnosis not present

## 2024-04-09 DIAGNOSIS — H25812 Combined forms of age-related cataract, left eye: Secondary | ICD-10-CM | POA: Diagnosis not present

## 2024-04-13 DIAGNOSIS — H0520 Unspecified exophthalmos: Secondary | ICD-10-CM | POA: Diagnosis not present

## 2024-04-26 DIAGNOSIS — I10 Essential (primary) hypertension: Secondary | ICD-10-CM | POA: Diagnosis not present

## 2024-04-26 DIAGNOSIS — E785 Hyperlipidemia, unspecified: Secondary | ICD-10-CM | POA: Diagnosis not present

## 2024-04-26 DIAGNOSIS — E1140 Type 2 diabetes mellitus with diabetic neuropathy, unspecified: Secondary | ICD-10-CM | POA: Diagnosis not present

## 2024-05-13 DIAGNOSIS — Z0100 Encounter for examination of eyes and vision without abnormal findings: Secondary | ICD-10-CM | POA: Diagnosis not present

## 2024-05-27 DIAGNOSIS — E1140 Type 2 diabetes mellitus with diabetic neuropathy, unspecified: Secondary | ICD-10-CM | POA: Diagnosis not present

## 2024-05-27 DIAGNOSIS — E785 Hyperlipidemia, unspecified: Secondary | ICD-10-CM | POA: Diagnosis not present

## 2024-05-27 DIAGNOSIS — I10 Essential (primary) hypertension: Secondary | ICD-10-CM | POA: Diagnosis not present

## 2024-06-02 DIAGNOSIS — M109 Gout, unspecified: Secondary | ICD-10-CM | POA: Diagnosis not present

## 2024-06-02 DIAGNOSIS — E1140 Type 2 diabetes mellitus with diabetic neuropathy, unspecified: Secondary | ICD-10-CM | POA: Diagnosis not present

## 2024-06-02 DIAGNOSIS — I10 Essential (primary) hypertension: Secondary | ICD-10-CM | POA: Diagnosis not present

## 2024-06-02 DIAGNOSIS — Z0000 Encounter for general adult medical examination without abnormal findings: Secondary | ICD-10-CM | POA: Diagnosis not present

## 2024-06-02 DIAGNOSIS — E785 Hyperlipidemia, unspecified: Secondary | ICD-10-CM | POA: Diagnosis not present

## 2024-06-02 DIAGNOSIS — Z1211 Encounter for screening for malignant neoplasm of colon: Secondary | ICD-10-CM | POA: Diagnosis not present

## 2024-06-02 DIAGNOSIS — Z23 Encounter for immunization: Secondary | ICD-10-CM | POA: Diagnosis not present

## 2024-06-07 DIAGNOSIS — E1140 Type 2 diabetes mellitus with diabetic neuropathy, unspecified: Secondary | ICD-10-CM | POA: Diagnosis not present

## 2024-06-27 DIAGNOSIS — E785 Hyperlipidemia, unspecified: Secondary | ICD-10-CM | POA: Diagnosis not present

## 2024-06-27 DIAGNOSIS — I10 Essential (primary) hypertension: Secondary | ICD-10-CM | POA: Diagnosis not present

## 2024-06-27 DIAGNOSIS — E1140 Type 2 diabetes mellitus with diabetic neuropathy, unspecified: Secondary | ICD-10-CM | POA: Diagnosis not present

## 2024-07-21 DIAGNOSIS — J0100 Acute maxillary sinusitis, unspecified: Secondary | ICD-10-CM | POA: Diagnosis not present

## 2024-07-27 DIAGNOSIS — I10 Essential (primary) hypertension: Secondary | ICD-10-CM | POA: Diagnosis not present

## 2024-07-27 DIAGNOSIS — E785 Hyperlipidemia, unspecified: Secondary | ICD-10-CM | POA: Diagnosis not present

## 2024-07-27 DIAGNOSIS — E1140 Type 2 diabetes mellitus with diabetic neuropathy, unspecified: Secondary | ICD-10-CM | POA: Diagnosis not present

## 2024-08-13 DIAGNOSIS — Z1211 Encounter for screening for malignant neoplasm of colon: Secondary | ICD-10-CM | POA: Diagnosis not present

## 2024-08-13 DIAGNOSIS — D124 Benign neoplasm of descending colon: Secondary | ICD-10-CM | POA: Diagnosis not present

## 2024-08-18 DIAGNOSIS — D124 Benign neoplasm of descending colon: Secondary | ICD-10-CM | POA: Diagnosis not present

## 2024-08-27 DIAGNOSIS — I10 Essential (primary) hypertension: Secondary | ICD-10-CM | POA: Diagnosis not present

## 2024-08-27 DIAGNOSIS — E1140 Type 2 diabetes mellitus with diabetic neuropathy, unspecified: Secondary | ICD-10-CM | POA: Diagnosis not present

## 2024-08-27 DIAGNOSIS — E785 Hyperlipidemia, unspecified: Secondary | ICD-10-CM | POA: Diagnosis not present

## 2024-09-26 DIAGNOSIS — E785 Hyperlipidemia, unspecified: Secondary | ICD-10-CM | POA: Diagnosis not present

## 2024-09-26 DIAGNOSIS — I10 Essential (primary) hypertension: Secondary | ICD-10-CM | POA: Diagnosis not present

## 2024-09-26 DIAGNOSIS — E1140 Type 2 diabetes mellitus with diabetic neuropathy, unspecified: Secondary | ICD-10-CM | POA: Diagnosis not present

## 2024-09-27 DIAGNOSIS — E1140 Type 2 diabetes mellitus with diabetic neuropathy, unspecified: Secondary | ICD-10-CM | POA: Diagnosis not present

## 2024-09-27 DIAGNOSIS — I10 Essential (primary) hypertension: Secondary | ICD-10-CM | POA: Diagnosis not present

## 2024-09-27 DIAGNOSIS — E785 Hyperlipidemia, unspecified: Secondary | ICD-10-CM | POA: Diagnosis not present

## 2024-09-27 DIAGNOSIS — M25572 Pain in left ankle and joints of left foot: Secondary | ICD-10-CM | POA: Diagnosis not present

## 2024-09-27 DIAGNOSIS — M109 Gout, unspecified: Secondary | ICD-10-CM | POA: Diagnosis not present

## 2024-09-27 DIAGNOSIS — Z23 Encounter for immunization: Secondary | ICD-10-CM | POA: Diagnosis not present

## 2024-10-05 DIAGNOSIS — M25572 Pain in left ankle and joints of left foot: Secondary | ICD-10-CM | POA: Diagnosis not present

## 2024-11-09 ENCOUNTER — Other Ambulatory Visit: Payer: Self-pay

## 2024-11-09 ENCOUNTER — Emergency Department (HOSPITAL_COMMUNITY)

## 2024-11-09 ENCOUNTER — Encounter (HOSPITAL_COMMUNITY): Payer: Self-pay

## 2024-11-09 ENCOUNTER — Emergency Department (HOSPITAL_COMMUNITY)
Admission: EM | Admit: 2024-11-09 | Discharge: 2024-11-09 | Disposition: A | Discharge status: Home / Self Care | Attending: Emergency Medicine | Admitting: Emergency Medicine

## 2024-11-09 DIAGNOSIS — R1011 Right upper quadrant pain: Secondary | ICD-10-CM | POA: Diagnosis not present

## 2024-11-09 DIAGNOSIS — R3915 Urgency of urination: Secondary | ICD-10-CM | POA: Insufficient documentation

## 2024-11-09 DIAGNOSIS — R519 Headache, unspecified: Secondary | ICD-10-CM | POA: Diagnosis not present

## 2024-11-09 DIAGNOSIS — E119 Type 2 diabetes mellitus without complications: Secondary | ICD-10-CM | POA: Diagnosis not present

## 2024-11-09 DIAGNOSIS — W19XXXA Unspecified fall, initial encounter: Secondary | ICD-10-CM | POA: Diagnosis not present

## 2024-11-09 DIAGNOSIS — R10811 Right upper quadrant abdominal tenderness: Secondary | ICD-10-CM

## 2024-11-09 DIAGNOSIS — Z7982 Long term (current) use of aspirin: Secondary | ICD-10-CM | POA: Diagnosis not present

## 2024-11-09 DIAGNOSIS — I10 Essential (primary) hypertension: Secondary | ICD-10-CM | POA: Insufficient documentation

## 2024-11-09 DIAGNOSIS — R42 Dizziness and giddiness: Secondary | ICD-10-CM | POA: Insufficient documentation

## 2024-11-09 DIAGNOSIS — Z79899 Other long term (current) drug therapy: Secondary | ICD-10-CM | POA: Insufficient documentation

## 2024-11-09 DIAGNOSIS — R509 Fever, unspecified: Secondary | ICD-10-CM | POA: Insufficient documentation

## 2024-11-09 DIAGNOSIS — R32 Unspecified urinary incontinence: Secondary | ICD-10-CM | POA: Diagnosis not present

## 2024-11-09 DIAGNOSIS — R350 Frequency of micturition: Secondary | ICD-10-CM | POA: Insufficient documentation

## 2024-11-09 DIAGNOSIS — Z7984 Long term (current) use of oral hypoglycemic drugs: Secondary | ICD-10-CM | POA: Diagnosis not present

## 2024-11-09 DIAGNOSIS — Z794 Long term (current) use of insulin: Secondary | ICD-10-CM | POA: Insufficient documentation

## 2024-11-09 LAB — COMPREHENSIVE METABOLIC PANEL WITH GFR
ALT: 49 U/L — ABNORMAL HIGH (ref 0–44)
AST: 61 U/L — ABNORMAL HIGH (ref 15–41)
Albumin: 3.7 g/dL (ref 3.5–5.0)
Alkaline Phosphatase: 139 U/L — ABNORMAL HIGH (ref 38–126)
Anion gap: 11 (ref 5–15)
BUN: 12 mg/dL (ref 8–23)
CO2: 18 mmol/L — ABNORMAL LOW (ref 22–32)
Calcium: 10.1 mg/dL (ref 8.9–10.3)
Chloride: 107 mmol/L (ref 98–111)
Creatinine, Ser: 1.01 mg/dL — ABNORMAL HIGH (ref 0.44–1.00)
GFR, Estimated: >60 mL/min
Glucose, Bld: 92 mg/dL (ref 70–99)
Potassium: 3.5 mmol/L (ref 3.5–5.1)
Sodium: 137 mmol/L (ref 135–145)
Total Bilirubin: 0.5 mg/dL (ref 0.0–1.2)
Total Protein: 7.3 g/dL (ref 6.5–8.1)

## 2024-11-09 LAB — CBC
HCT: 39.4 % (ref 36.0–46.0)
Hemoglobin: 13.4 g/dL (ref 12.0–15.0)
MCH: 30.7 pg (ref 26.0–34.0)
MCHC: 34 g/dL (ref 30.0–36.0)
MCV: 90.4 fL (ref 80.0–100.0)
Platelets: 215 K/uL (ref 150–400)
RBC: 4.36 MIL/uL (ref 3.87–5.11)
RDW: 14.1 % (ref 11.5–15.5)
WBC: 6.8 K/uL (ref 4.0–10.5)
nRBC: 0 % (ref 0.0–0.2)

## 2024-11-09 LAB — URINALYSIS, ROUTINE W REFLEX MICROSCOPIC
Bacteria, UA: NONE SEEN
Bilirubin Urine: NEGATIVE
Glucose, UA: NEGATIVE mg/dL
Ketones, ur: NEGATIVE mg/dL
Leukocytes,Ua: NEGATIVE
Nitrite: NEGATIVE
Protein, ur: 100 mg/dL — AB
Specific Gravity, Urine: 1.027 (ref 1.005–1.030)
pH: 5 (ref 5.0–8.0)

## 2024-11-09 LAB — CBG MONITORING, ED: Glucose-Capillary: 95 mg/dL (ref 70–99)

## 2024-11-09 MED ORDER — LACTATED RINGERS IV BOLUS: 500.0000 mL | Freq: Once | INTRAVENOUS | Status: DC

## 2024-11-09 NOTE — Discharge Instructions (Addendum)
 It was a pleasure meeting with you today.  As we discussed there were no acute findings on your EKG, imaging, lab work, or urine testing today.  Follow-up with your primary care physician for ongoing evaluation.  Return to ED if symptoms worsen or new concerning symptoms develop.  You do have a low-grade fever in the ED today. This could be related to a viral illness as they are common this time of year. I have included information about management for these. We primarily use over-the-counter medications for these, see below.  Read the instructions below on reasons to return to the emergency department and to learn more about your diagnosis.  Use over the counter medications for symptomatic relief as we discussed (musinex as a decongestant, Tylenol  for fever/pain, Motrin/Ibuprofen for muscle aches). Followup with your primary care doctor in 4 days if your symptoms persist.  You're more than welcome to return to the emergency department if symptoms worsen or become concerning.  Upper Respiratory Infection, Adult  An upper respiratory infection (URI) is also sometimes known as the common cold. Most people improve within 1 week, but symptoms can last up to 2 weeks. A residual cough may last even longer.   URI is most commonly caused by a virus. Viruses are NOT treated with antibiotics. You can easily spread the virus to others by oral contact. This includes kissing, sharing a glass, coughing, or sneezing. Touching your mouth or nose and then touching a surface, which is then touched by another person, can also spread the virus.   TREATMENT  Treatment is directed at relieving symptoms. There is no cure. Antibiotics are not effective, because the infection is caused by a virus, not by bacteria. Treatment may include:  Increased fluid intake. Sports drinks offer valuable electrolytes, sugars, and fluids.  Breathing heated mist or steam (vaporizer or shower).  Eating chicken soup or other clear broths, and  maintaining good nutrition.  Getting plenty of rest.  Using gargles or lozenges for comfort.  Controlling fevers with ibuprofen or acetaminophen  as directed by your caregiver.  Increasing usage of your inhaler if you have asthma.  Return to work when your temperature has returned to normal.   SEEK MEDICAL CARE IF:  After the first few days, you feel you are getting worse rather than better.  You develop worsening shortness of breath, or brown or red sputum. These may be signs of pneumonia.  You develop yellow or brown nasal discharge or pain in the face, especially when you bend forward. These may be signs of sinusitis.  You develop a fever, swollen neck glands, pain with swallowing, or white areas in the back of your throat. These may be signs of strep throat.

## 2024-11-09 NOTE — ED Provider Notes (Signed)
 " [place] EMERGENCY DEPARTMENT AT [facility] Provider Note   CSN: [id number] Arrival date & time: 11/09/24  1421     Patient presents with: Fall and Dizziness   Julie Foster is a 69 y.o. female.  With pertinent medical history of hypertension, hypercalcemia, multinodular goiter, diabetes, gout.   Patient is here for evaluation after mechanical fall, dizziness, headaches, and urinary symptoms.  Patient is accompanied by her husband today.  Patient has been having intermittent dizziness and headaches since Sunday.  She has a history of vertigo and states this feels like previous vertigo.  She had an appointment with her primary care physician for today to evaluate this however she was unable to make it to her appointment secondary to today's fall.  She typically gets an injection about every other year when she has vertigo symptoms.  Patient has been experiencing urinary urgency and foul-smelling urine beginning today.  She has had some urinary incontinence secondary to being unable to make it to the bathroom in time.  She still has the urgency to go.  Denies any fecal incontinence.  Denies any falls or injuries prior to today.  Today's fall occurred when she was getting up quickly from her bed to go to the bathroom, she felt dizzy and lowered herself to the ground.  Denies head injury or LOC.  Denies any pain or concern for injuries.  Denies flank pain, abdominal pain, nausea, vomiting, or diarrhea.  Last bowel movement was earlier today and was normal.  Denies any syncopal episodes.  She is not taking a blood thinner.  Per EMS report, patient had low-grade fever of 99.76F and was given 650 mg Tylenol  en-route.  The history is provided by the patient and the spouse.  Fall  Dizziness      Prior to Admission medications  Medication Sig Start Date End Date Taking? Authorizing Provider  allopurinol (ZYLOPRIM) 100 MG tablet Take 100 mg by mouth daily. 06/23/18    [provider]  aspirin EC 81 MG tablet Take 81 mg by mouth daily with breakfast.    [provider]  atorvastatin (LIPITOR) 10 MG tablet Take 10 mg by mouth daily. 06/02/18   [provider]  azelastine (ASTELIN) 0.1 % nasal spray Place into both nostrils. 10/16/20   [provider]  colchicine 0.6 MG tablet  02/24/19   [provider]  Dulaglutide  (TRULICITY ) 4.5 MG/0.5ML SOPN Inject 4.5 mg into the skin once a week. 02/05/23     ferrous sulfate 325 (65 FE) MG EC tablet Take 325 mg by mouth daily with breakfast.    [provider]  HYDROcodone -acetaminophen  (NORCO/VICODIN) 5-325 MG tablet Take 1 tablet by mouth at bedtime as needed for moderate pain. 05/31/21   Hilts, Ozell, MD  indapamide (LOZOL) 2.5 MG tablet Take 2.5 mg by mouth daily with breakfast.    [provider]  lisinopril (PRINIVIL,ZESTRIL) 20 MG tablet Take 20 mg by mouth daily with breakfast.    [provider]  meclizine  (ANTIVERT ) 25 MG tablet Take 1 tablet (25 mg total) by mouth 3 (three) times daily as needed for dizziness. 06/29/14   Baxter Drivers, MD  metFORMIN (GLUCOPHAGE) 500 MG tablet Take 500 mg by mouth daily with breakfast.    [provider]  MULTIPLE VITAMIN PO Take by mouth.    [provider]  NONFORMULARY OR COMPOUNDED ITEM 3 (three) times daily. Patient takes all 3 every morning for inflammation.    [provider]  Omega-3 Fatty Acids (FISH OIL PO) Take 2 capsules by mouth daily with breakfast.    [provider]  ondansetron  (ZOFRAN -ODT) 8 MG disintegrating tablet Take 1 tablet (8 mg total) by mouth every 8 (eight) hours as needed for nausea or vomiting. 06/29/14   Baxter Drivers, MD  potassium chloride SA (K-DUR,KLOR-CON) 20 MEQ tablet Take 20 mEq by mouth daily. 06/02/18   [provider]  predniSONE  (STERAPRED UNI-PAK 21 TAB) 10 MG (21) TBPK tablet Take by mouth daily. Take 6 tabs by mouth daily  for 2  days, then 5 tabs for 2 days, then 4 tabs for 2 days, then 3 tabs for 2 days, 2 tabs for 2 days, then 1 tab by mouth daily for 2 days 08/26/23   Edwardo Marsa HERO, PA-C    Allergies: Patient has no known allergies.    Review of Systems  Genitourinary:  Positive for urgency.  Neurological:  Positive for dizziness.    Updated Vital Signs BP 137/87 (BP Location: Left Arm)   Pulse 88   Temp 98.3 F (36.8 C) (Oral)   Resp 18   SpO2 95%   Physical Exam Vitals and nursing note reviewed.  Constitutional:      General: She is not in acute distress.    Appearance: Normal appearance. She is obese. She is not ill-appearing, toxic-appearing or diaphoretic.  HENT:     Head: Normocephalic and atraumatic.     Nose: Nose normal. No congestion or rhinorrhea.     Mouth/Throat:     Mouth: Mucous membranes are moist.  Eyes:     General: No scleral icterus.    Extraocular Movements: Extraocular movements intact.     Conjunctiva/sclera: Conjunctivae normal.     Pupils: Pupils are equal, round, and reactive to light.  Cardiovascular:     Rate and Rhythm: Normal rate and regular rhythm.     Pulses: Normal pulses.     Heart sounds: Normal heart sounds.  Pulmonary:     Effort: Pulmonary effort is normal. No respiratory distress.     Breath sounds: Normal breath sounds. No stridor. No wheezing, rhonchi or rales.  Abdominal:     General: Abdomen is flat. Bowel sounds are normal. There is no distension.     Palpations: Abdomen is soft.     Tenderness: There is abdominal tenderness (RUQ) in the right upper quadrant. There is no guarding. Negative signs include Murphy's sign, Rovsing's sign and McBurney's sign.  Musculoskeletal:     Cervical back: Normal range of motion.  Skin:    General: Skin is warm and dry.     Capillary Refill: Capillary refill takes less than 2 seconds.     Coloration: Skin is not jaundiced or pale.  Neurological:     Mental Status: She is alert and oriented to person,  place, and time.     (all labs ordered are listed, but only abnormal results are displayed) Labs Reviewed - No data to display  EKG: None  Radiology: No results found.   Procedures   Medications Ordered in the ED - No data to display   Patient presents to the ED for concern of urinary frequency, RUQ abdominal pain, dizziness this involves an extensive number of treatment options, and is a complaint that carries with it a high risk of complications and morbidity.  The differential diagnosis includes biliary disease, UTI, hyperglycemia, DKA, vertigo, arrhythmia, ACS, dehydration, electrolyte imbalance.   Co morbidities that complicate the patient evaluation  Diabetes   Additional history obtained:  Additional history obtained from EMS and Outside Medical Records   External records from outside source obtained and reviewed including EMS report and review of previous labs for comparison.   Lab Tests:  I Ordered, and personally interpreted labs.  The pertinent results include:  Mild transaminitis AST 61, ALT 49, and Alk phos 139 (elevated compared to labs 3 years ago). Cr of 1.01 (similar to previous 6 years ago). Urinalysis not indicative of infection.   Imaging Studies ordered:  I ordered imaging studies including RUQ ultrasound  I independently visualized and interpreted imaging which showed fatty liver, otherwise unremarkable RUQ US . I agree with the radiologist interpretation   Cardiac Monitoring:  The patient was maintained on a cardiac monitor.  I personally viewed and interpreted the cardiac monitored which showed an underlying rhythm of: Sinus rhythm with no evidence of ST elevation.   Problem List / ED Course:     Vertigo. Patient with history of vertigo and states symptoms are the same as previous. EKG, lab work, and vitals are unremarkable. I do not feel additional emergent work up warranted at this time. Encouraged follow up with primary care for ongoing  evaluation. Return precautions discussed and patient verbalized understanding. RUQ tenderness on exam and elevated LFTs. Obtained RUQ ultrasound for potential biliary process. No acute biliary process seen, though fatty liver noted. Most recent labwork to compare LFTs to are 69 years old. Encouraged patient to follow-up with primary care for ongoing evaluation of LFTs and fatty liver.  Low-grade fever. Noted to have low-grade fever of 99.53F in the ED today. Viral URIs have been common in our area recently. I included information about outpatient management of these illnesses with her dispo paperwork.   Reevaluation:  After the interventions noted above, I reevaluated the patient and found that they have :improved   Dispostion:  After consideration of the diagnostic results and the patients response to treatment, I feel that the patent would benefit from supportive care in the home setting, continued monitoring of symptoms, and close follow-up with primary care.  Return precautions given.                                  Medical Decision Making Amount and/or Complexity of Data Reviewed Labs: ordered. Radiology: ordered.   This note was produced using Electronics Engineer. While the provider has reviewed and verified all clinical information, transcription errors may remain.    Final diagnoses:  None    ED Discharge Orders     None          Julie Foster 11/09/24 2159    Julie Ozell DELENA, DO 11/16/24 0002  "

## 2024-11-09 NOTE — ED Triage Notes (Signed)
 Pt BIB EMS from Home due to Fall and dizziness. Pt reports dizziness prior to fall Hx of dizziness in past. No LOC, no blood thinners, no head or neck injuries, no pain, did not hit head. Pt also reports urinary incontinence that started today.  Temp 99.6 HR 95 AAOx4 RR 24 SpO2 95% RA CBG 117 650 mg Tylenol
